# Patient Record
Sex: Female | Born: 1940 | Race: White | Hispanic: No | Marital: Married
Health system: Southern US, Community
[De-identification: ages and names within clinical notes are randomized; demographics above are authoritative.]

---

## 2005-04-08 ENCOUNTER — Emergency Department: Payer: Self-pay | Admitting: Emergency Medicine

## 2006-05-13 ENCOUNTER — Ambulatory Visit: Payer: Self-pay | Admitting: Ophthalmology

## 2006-05-20 ENCOUNTER — Ambulatory Visit: Payer: Self-pay | Admitting: Ophthalmology

## 2006-07-02 ENCOUNTER — Ambulatory Visit: Payer: Self-pay | Admitting: Ophthalmology

## 2006-07-17 ENCOUNTER — Ambulatory Visit: Payer: Self-pay | Admitting: Ophthalmology

## 2007-03-04 ENCOUNTER — Ambulatory Visit: Payer: Self-pay | Admitting: Gastroenterology

## 2008-11-17 ENCOUNTER — Ambulatory Visit: Payer: Self-pay

## 2009-05-31 ENCOUNTER — Inpatient Hospital Stay: Payer: Self-pay | Admitting: Surgery

## 2010-05-01 ENCOUNTER — Ambulatory Visit: Payer: Self-pay | Admitting: Ophthalmology

## 2010-05-15 ENCOUNTER — Ambulatory Visit: Payer: Self-pay | Admitting: Ophthalmology

## 2011-01-05 ENCOUNTER — Inpatient Hospital Stay: Payer: Self-pay | Admitting: Internal Medicine

## 2011-05-09 ENCOUNTER — Ambulatory Visit: Payer: Self-pay | Admitting: Ophthalmology

## 2011-05-23 ENCOUNTER — Ambulatory Visit: Payer: Self-pay | Admitting: Ophthalmology

## 2011-12-05 ENCOUNTER — Ambulatory Visit: Payer: Self-pay | Admitting: Specialist

## 2012-01-09 ENCOUNTER — Ambulatory Visit: Payer: Self-pay | Admitting: Specialist

## 2012-07-16 ENCOUNTER — Ambulatory Visit: Payer: Self-pay | Admitting: Specialist

## 2012-10-29 ENCOUNTER — Inpatient Hospital Stay: Payer: Self-pay | Admitting: Internal Medicine

## 2012-10-29 LAB — CBC
MCHC: 33.7 g/dL (ref 32.0–36.0)
MCV: 90 fL (ref 80–100)
Platelet: 210 10*3/uL (ref 150–440)
RBC: 4.44 10*6/uL (ref 3.80–5.20)
RDW: 15.5 % — ABNORMAL HIGH (ref 11.5–14.5)
WBC: 11.4 10*3/uL — ABNORMAL HIGH (ref 3.6–11.0)

## 2012-10-29 LAB — COMPREHENSIVE METABOLIC PANEL
Anion Gap: 5 — ABNORMAL LOW (ref 7–16)
BUN: 14 mg/dL (ref 7–18)
Bilirubin,Total: 0.3 mg/dL (ref 0.2–1.0)
Chloride: 102 mmol/L (ref 98–107)
Creatinine: 0.79 mg/dL (ref 0.60–1.30)
EGFR (African American): >60
Glucose: 115 mg/dL — ABNORMAL HIGH (ref 65–99)
Osmolality: 277 (ref 275–301)
Total Protein: 7.4 g/dL (ref 6.4–8.2)

## 2012-10-29 LAB — URINALYSIS, COMPLETE
Bilirubin,UR: NEGATIVE
Blood: NEGATIVE
Leukocyte Esterase: NEGATIVE
Nitrite: POSITIVE
Ph: 6 (ref 4.5–8.0)
Protein: 100
Specific Gravity: 1.017 (ref 1.003–1.030)
WBC UR: 4 /HPF (ref 0–5)

## 2012-10-29 LAB — CK TOTAL AND CKMB (NOT AT ARMC)
CK, Total: 99 U/L (ref 21–215)
CK-MB: 1.5 ng/mL (ref 0.5–3.6)

## 2012-10-30 LAB — CBC WITH DIFFERENTIAL/PLATELET
Basophil #: 0 10*3/uL (ref 0.0–0.1)
Eosinophil %: 0 %
HGB: 12.8 g/dL (ref 12.0–16.0)
Lymphocyte #: 0.5 10*3/uL — ABNORMAL LOW (ref 1.0–3.6)
MCH: 30.2 pg (ref 26.0–34.0)
MCV: 91 fL (ref 80–100)
Monocyte #: 0.1 x10 3/mm — ABNORMAL LOW (ref 0.2–0.9)
Monocyte %: 1.1 %
Neutrophil %: 93.5 %
Platelet: 198 10*3/uL (ref 150–440)
RBC: 4.24 10*6/uL (ref 3.80–5.20)
RDW: 15.9 % — ABNORMAL HIGH (ref 11.5–14.5)
WBC: 8.6 10*3/uL (ref 3.6–11.0)

## 2012-10-30 LAB — BASIC METABOLIC PANEL
Anion Gap: 5 — ABNORMAL LOW (ref 7–16)
BUN: 13 mg/dL (ref 7–18)
Calcium, Total: 8.7 mg/dL (ref 8.5–10.1)
Co2: 32 mmol/L (ref 21–32)
Creatinine: 0.71 mg/dL (ref 0.60–1.30)
EGFR (African American): >60
EGFR (Non-African Amer.): >60
Glucose: 146 mg/dL — ABNORMAL HIGH (ref 65–99)
Osmolality: 277 (ref 275–301)
Potassium: 3.8 mmol/L (ref 3.5–5.1)

## 2012-10-30 LAB — MAGNESIUM: Magnesium: 1.9 mg/dL

## 2012-11-04 LAB — CULTURE, BLOOD (SINGLE)

## 2013-01-05 ENCOUNTER — Inpatient Hospital Stay: Payer: Self-pay | Admitting: Internal Medicine

## 2013-01-05 LAB — CBC WITH DIFFERENTIAL/PLATELET
Basophil #: 0 10*3/uL (ref 0.0–0.1)
Eosinophil #: 0 10*3/uL (ref 0.0–0.7)
Eosinophil %: 0 %
HCT: 43.3 % (ref 35.0–47.0)
Lymphocyte %: 5.9 %
MCHC: 33.2 g/dL (ref 32.0–36.0)
MCV: 94 fL (ref 80–100)
Monocyte #: 1.2 x10 3/mm — ABNORMAL HIGH (ref 0.2–0.9)
Monocyte %: 7.7 %
Neutrophil %: 86.2 %
RDW: 14.9 % — ABNORMAL HIGH (ref 11.5–14.5)
WBC: 16 10*3/uL — ABNORMAL HIGH (ref 3.6–11.0)

## 2013-01-05 LAB — COMPREHENSIVE METABOLIC PANEL
Albumin: 3.4 g/dL (ref 3.4–5.0)
Alkaline Phosphatase: 105 U/L (ref 50–136)
Anion Gap: 6 — ABNORMAL LOW (ref 7–16)
Bilirubin,Total: 0.2 mg/dL (ref 0.2–1.0)
Chloride: 100 mmol/L (ref 98–107)
EGFR (African American): 55 — ABNORMAL LOW
EGFR (Non-African Amer.): 47 — ABNORMAL LOW
Potassium: 4.4 mmol/L (ref 3.5–5.1)
SGPT (ALT): 26 U/L (ref 12–78)

## 2013-01-05 LAB — TROPONIN I
Troponin-I: 0.04 ng/mL
Troponin-I: 0.04 ng/mL

## 2013-01-06 LAB — BASIC METABOLIC PANEL
Anion Gap: 8 (ref 7–16)
Chloride: 96 mmol/L — ABNORMAL LOW (ref 98–107)
Creatinine: 1.03 mg/dL (ref 0.60–1.30)
EGFR (African American): >60
EGFR (Non-African Amer.): 54 — ABNORMAL LOW
Glucose: 140 mg/dL — ABNORMAL HIGH (ref 65–99)
Osmolality: 285 (ref 275–301)
Sodium: 138 mmol/L (ref 136–145)

## 2013-01-06 LAB — CBC WITH DIFFERENTIAL/PLATELET
Basophil #: 0 10*3/uL (ref 0.0–0.1)
Basophil %: 0.3 %
Lymphocyte #: 1 10*3/uL (ref 1.0–3.6)
Lymphocyte %: 7.5 %
MCV: 94 fL (ref 80–100)
Monocyte %: 2.9 %
Neutrophil %: 89.3 %
Platelet: 278 10*3/uL (ref 150–440)
RBC: 4.56 10*6/uL (ref 3.80–5.20)
RDW: 14.8 % — ABNORMAL HIGH (ref 11.5–14.5)

## 2013-01-08 LAB — BASIC METABOLIC PANEL
Anion Gap: 9 (ref 7–16)
Chloride: 93 mmol/L — ABNORMAL LOW (ref 98–107)
Co2: 32 mmol/L (ref 21–32)
Creatinine: 1.29 mg/dL (ref 0.60–1.30)
EGFR (African American): 48 — ABNORMAL LOW
EGFR (Non-African Amer.): 41 — ABNORMAL LOW
Glucose: 118 mg/dL — ABNORMAL HIGH (ref 65–99)
Osmolality: 289 (ref 275–301)
Potassium: 4.1 mmol/L (ref 3.5–5.1)
Sodium: 134 mmol/L — ABNORMAL LOW (ref 136–145)

## 2013-01-08 LAB — CBC WITH DIFFERENTIAL/PLATELET
Basophil #: 0.1 10*3/uL (ref 0.0–0.1)
Basophil %: 0.2 %
Comment - H1-Com3: NORMAL
Eosinophil #: 0 10*3/uL (ref 0.0–0.7)
HCT: 49.4 % — ABNORMAL HIGH (ref 35.0–47.0)
HGB: 15.2 g/dL (ref 12.0–16.0)
HGB: 16.5 g/dL — ABNORMAL HIGH (ref 12.0–16.0)
Lymphocyte %: 3.8 %
Lymphocytes: 4 %
MCHC: 33.4 g/dL (ref 32.0–36.0)
MCHC: 33.3 g/dL (ref 32.0–36.0)
MCV: 93 fL (ref 80–100)
Monocyte #: 2.3 x10 3/mm — ABNORMAL HIGH (ref 0.2–0.9)
Monocytes: 7 %
Neutrophil #: 23.6 10*3/uL — ABNORMAL HIGH (ref 1.4–6.5)
Neutrophil %: 87.3 %
Platelet: 332 10*3/uL (ref 150–440)
RBC: 4.88 10*6/uL (ref 3.80–5.20)
RBC: 5.34 10*6/uL — ABNORMAL HIGH (ref 3.80–5.20)
RDW: 14.3 % (ref 11.5–14.5)
RDW: 14.3 % (ref 11.5–14.5)
Variant Lymphocyte - H1-Rlymph: 2 %

## 2013-01-08 LAB — HEMOGLOBIN: HGB: 14.3 g/dL (ref 12.0–16.0)

## 2013-01-09 LAB — CBC WITH DIFFERENTIAL/PLATELET
Basophil #: 0 10*3/uL (ref 0.0–0.1)
Basophil %: 0.2 %
Eosinophil #: 0 10*3/uL (ref 0.0–0.7)
Eosinophil #: 0 10*3/uL (ref 0.0–0.7)
Eosinophil %: 0.1 %
HCT: 47.8 % — ABNORMAL HIGH (ref 35.0–47.0)
HCT: 39.1 % (ref 35.0–47.0)
HGB: 15.9 g/dL (ref 12.0–16.0)
Lymphocyte #: 1.7 10*3/uL (ref 1.0–3.6)
Lymphocyte #: 0.7 10*3/uL — ABNORMAL LOW (ref 1.0–3.6)
Lymphocyte %: 7.1 %
Lymphocyte %: 2.4 %
MCH: 30.8 pg (ref 26.0–34.0)
MCH: 31 pg (ref 26.0–34.0)
MCHC: 33.3 g/dL (ref 32.0–36.0)
MCV: 92 fL (ref 80–100)
Monocyte #: 1 x10 3/mm — ABNORMAL HIGH (ref 0.2–0.9)
Monocyte %: 9.4 %
Monocyte %: 3.6 %
Neutrophil %: 83.2 %
Neutrophil %: 93.8 %
Platelet: 304 10*3/uL (ref 150–440)
Platelet: 208 10*3/uL (ref 150–440)
RBC: 4.25 10*6/uL (ref 3.80–5.20)
RDW: 14.3 % (ref 11.5–14.5)
RDW: 14.5 % (ref 11.5–14.5)
WBC: 23.8 10*3/uL — ABNORMAL HIGH (ref 3.6–11.0)
WBC: 26.7 10*3/uL — ABNORMAL HIGH (ref 3.6–11.0)

## 2013-01-10 LAB — CBC WITH DIFFERENTIAL/PLATELET
Basophil %: 0.1 %
Eosinophil #: 0 10*3/uL (ref 0.0–0.7)
Eosinophil %: 0.1 %
HCT: 36.9 % (ref 35.0–47.0)
HGB: 12.3 g/dL (ref 12.0–16.0)
Lymphocyte %: 7.5 %
MCHC: 33.4 g/dL (ref 32.0–36.0)
MCV: 93 fL (ref 80–100)
Monocyte #: 2.2 x10 3/mm — ABNORMAL HIGH (ref 0.2–0.9)
Monocyte %: 7.4 %
RBC: 3.97 10*6/uL (ref 3.80–5.20)
WBC: 29.9 10*3/uL — ABNORMAL HIGH (ref 3.6–11.0)

## 2013-01-10 LAB — CULTURE, BLOOD (SINGLE)

## 2013-01-11 LAB — CBC WITH DIFFERENTIAL/PLATELET
Eosinophil %: 0.4 %
HGB: 11.5 g/dL — ABNORMAL LOW (ref 12.0–16.0)
Lymphocyte #: 2.4 10*3/uL (ref 1.0–3.6)
MCH: 31 pg (ref 26.0–34.0)
MCHC: 33.3 g/dL (ref 32.0–36.0)
MCV: 93 fL (ref 80–100)
Monocyte #: 1.7 x10 3/mm — ABNORMAL HIGH (ref 0.2–0.9)
Monocyte %: 6.5 %
Neutrophil #: 21.2 10*3/uL — ABNORMAL HIGH (ref 1.4–6.5)
RDW: 14.1 % (ref 11.5–14.5)
WBC: 25.4 10*3/uL — ABNORMAL HIGH (ref 3.6–11.0)

## 2013-01-11 LAB — BASIC METABOLIC PANEL
Anion Gap: 5 — ABNORMAL LOW (ref 7–16)
BUN: 21 mg/dL — ABNORMAL HIGH (ref 7–18)
Chloride: 98 mmol/L (ref 98–107)
Co2: 34 mmol/L — ABNORMAL HIGH (ref 21–32)
EGFR (Non-African Amer.): >60
Osmolality: 276 (ref 275–301)
Potassium: 3.5 mmol/L (ref 3.5–5.1)
Sodium: 137 mmol/L (ref 136–145)

## 2013-01-12 LAB — CBC WITH DIFFERENTIAL/PLATELET
Eosinophil #: 0 10*3/uL (ref 0.0–0.7)
HGB: 11.1 g/dL — ABNORMAL LOW (ref 12.0–16.0)
Lymphocyte %: 8.7 %
MCV: 93 fL (ref 80–100)
Monocyte #: 1.4 x10 3/mm — ABNORMAL HIGH (ref 0.2–0.9)
Monocyte %: 6.3 %
Neutrophil %: 84.8 %
Platelet: 188 10*3/uL (ref 150–440)
RBC: 3.56 10*6/uL — ABNORMAL LOW (ref 3.80–5.20)
RDW: 13.9 % (ref 11.5–14.5)
WBC: 22.3 10*3/uL — ABNORMAL HIGH (ref 3.6–11.0)

## 2013-01-12 LAB — DIGOXIN LEVEL: Digoxin: 1.29 ng/mL

## 2013-01-12 LAB — PATHOLOGY REPORT

## 2013-01-13 LAB — CBC WITH DIFFERENTIAL/PLATELET
HCT: 33.8 % — ABNORMAL LOW (ref 35.0–47.0)
Lymphocytes: 6 %
MCH: 30.9 pg (ref 26.0–34.0)
MCHC: 33.2 g/dL (ref 32.0–36.0)
Monocytes: 6 %
Myelocyte: 2 %
RBC: 3.64 10*6/uL — ABNORMAL LOW (ref 3.80–5.20)
RDW: 14.2 % (ref 11.5–14.5)
WBC: 19.5 10*3/uL — ABNORMAL HIGH (ref 3.6–11.0)

## 2013-02-19 ENCOUNTER — Ambulatory Visit: Payer: Self-pay | Admitting: Specialist

## 2013-07-13 ENCOUNTER — Ambulatory Visit: Payer: Self-pay | Admitting: Specialist

## 2013-08-22 IMAGING — CT CT CHEST W/O CM
1 of 2 series · 14 of 32 positions shown, 18 images · non-contrast
Comparison: none

REASON FOR EXAM: Pulmonary Nodule
COMMENTS:

[Series 2: chest w/o 3.0 i31f 2 · axial · non-contrast · 0.66mm/px · z∈[-640,-391]mm · 14 of 99 slices shown, 18 images]
[im 8/99  mediastinal]
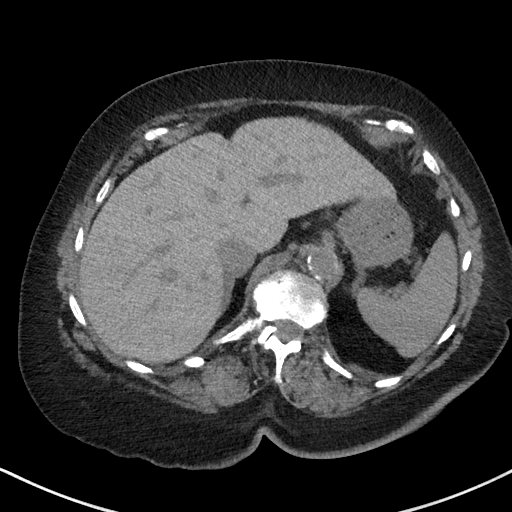
[im 8/99  lung]
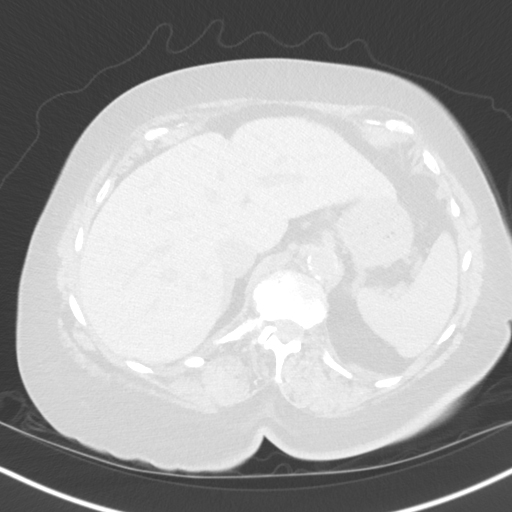
[im 16/99  lung]
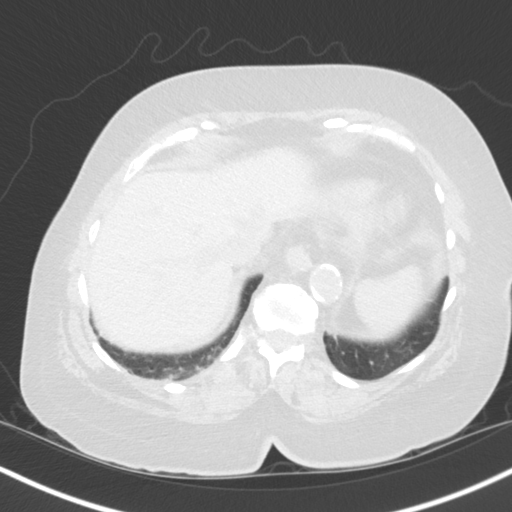
[im 23/99  lung]
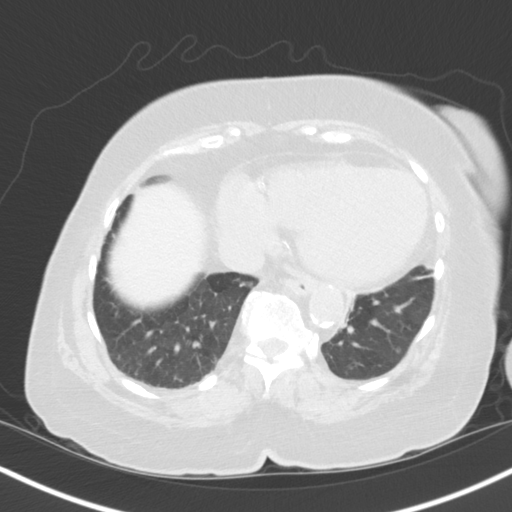
[im 31/99  lung]
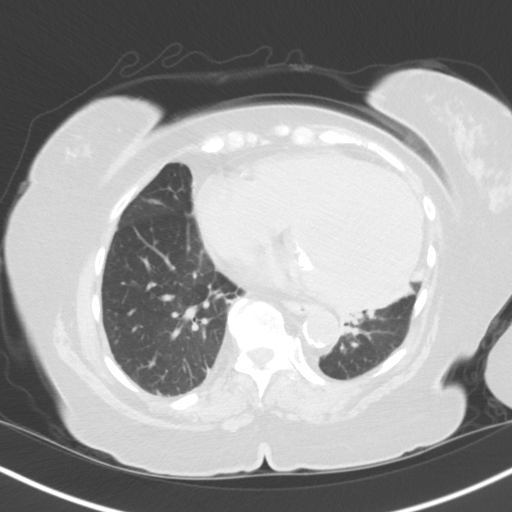
[im 38/99  mediastinal]
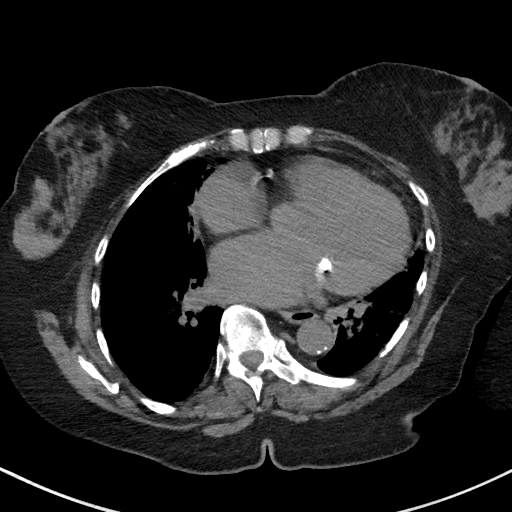
[im 38/99  lung]
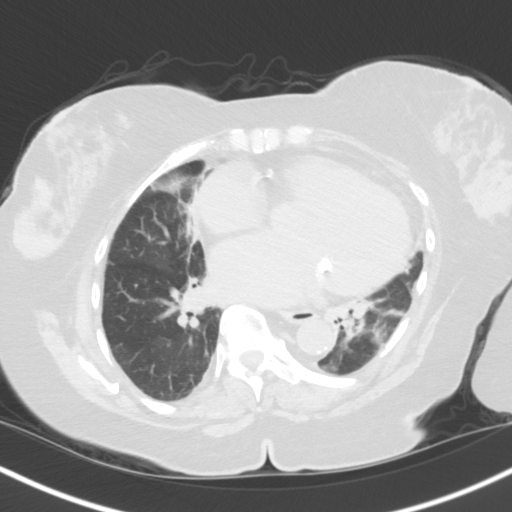
[im 46/99  lung]
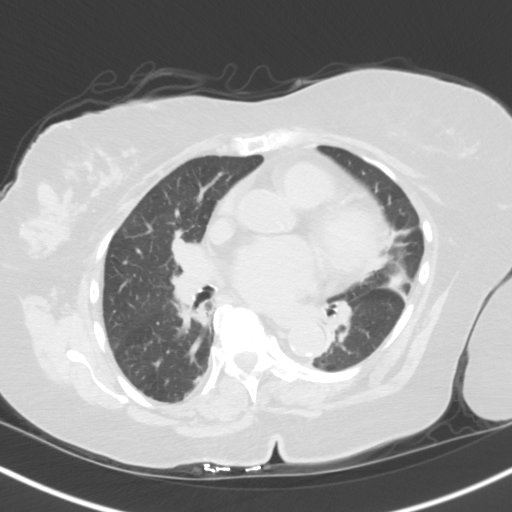
[im 47/99  lung]
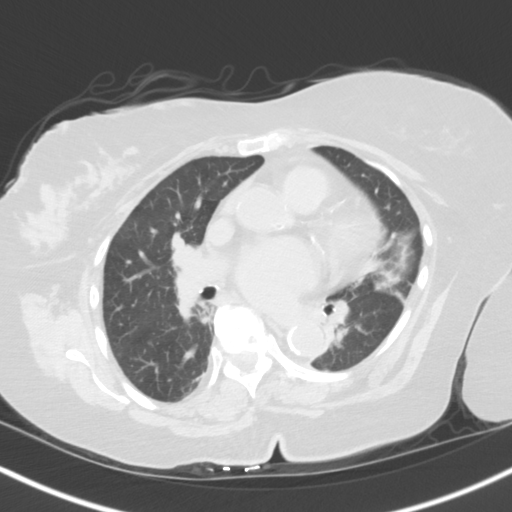
[im 50/99  lung]
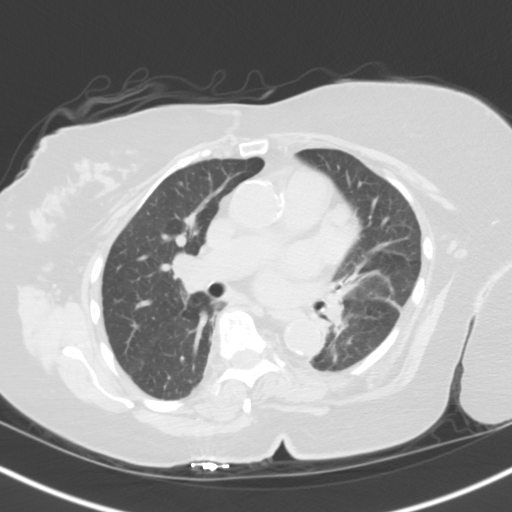
[im 53/99  mediastinal]
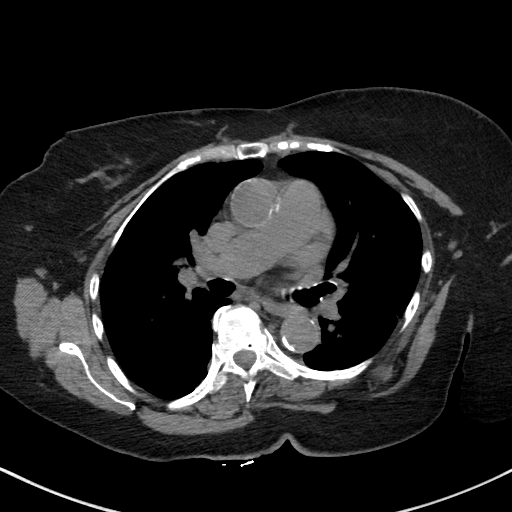
[im 53/99  lung]
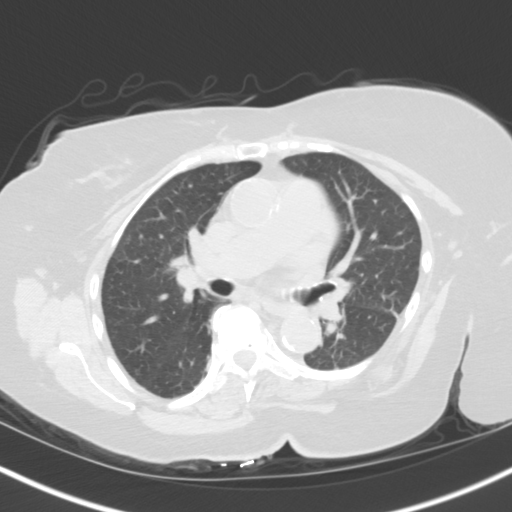
[im 61/99  lung]
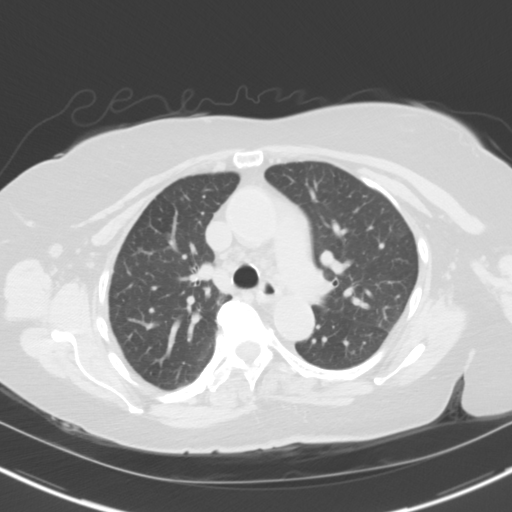
[im 68/99  lung]
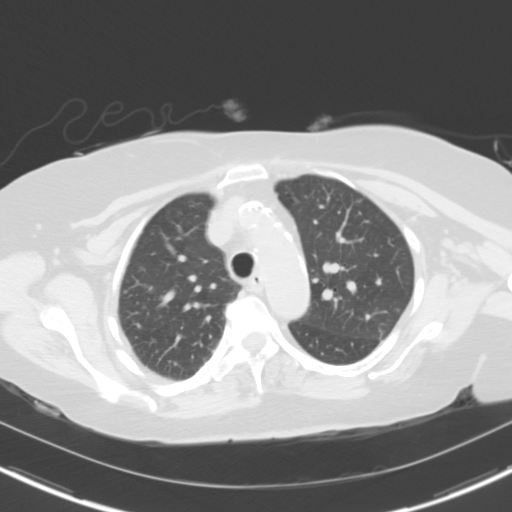
[im 76/99  lung]
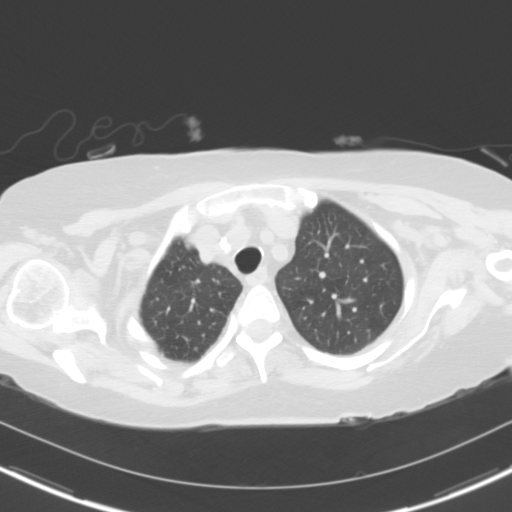
[im 83/99  mediastinal]
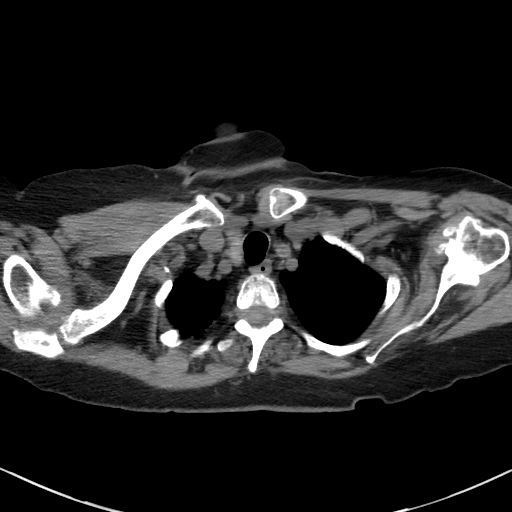
[im 83/99  lung]
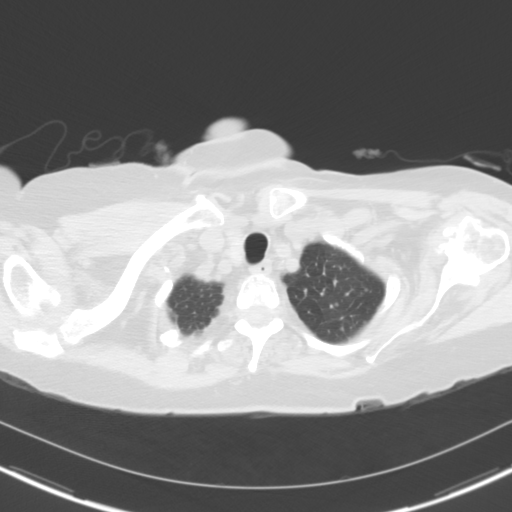
[im 91/99  lung]
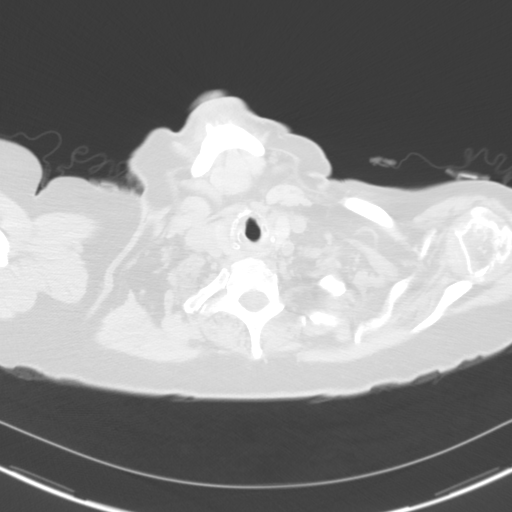

[14 of 32 positions shown; findings below may reference images not displayed]

PROCEDURE:     KCT - KCT CHEST WITHOUT CONTRAST  - July 13, 2013 [DATE]

RESULT:     Axial noncontrast CT scanning was performed through the chest
with reconstructions at 3 mm intervals and slice thicknesses. Comparison is
made to a study January 13, 2013 and to a study July 16, 2012. Review
of multiplanar reconstructed images was performed separately on the VIA
monitor.

In the right upper lobe on image 26 there is a stable 2 mm diameter nodule.
On image 44 in the right upper lobe subpleural A there is a stable
approximately 4 mm diameter nodule. On image 52 in the posterior lateral
aspect of the right upper lobe there is a stable 3 mm diameter nodule on
image 57 a stable subpleural is present in the extreme infero- lateral
aspect of the right upper lobe. On image 28 there is a stable nodule in the
posterior aspect of the left upper lobe. On image 29 in the extreme superior
lateral aspect of the left lower lobe there is a stable 2 mm diameter
nodule.

The cardiac chambers are enlarged. There is mural calcification within the
normal calibered thoracic aorta. There are coronary artery calcifications
present. No bulky mediastinal or hilar lymph nodes are evident.

Within the upper abdomen the observed portions of the liver and spleen and
adrenal glands appear normal.
IMPRESSION: 1. There are multiple stable subcentimeter nodules in both lungs. These are
most conspicuous on the right. I do not see new nodules.
2. There is no classic alveolar pneumonia. There are patchy areas of
increased interstitial density in the left lower lobe which are not new.
Previously demonstrated pleural effusions have resolved.
3. There is stable enlargement of the cardiac chambers.

[REDACTED]

## 2014-05-19 ENCOUNTER — Inpatient Hospital Stay: Payer: Self-pay

## 2014-05-19 LAB — BASIC METABOLIC PANEL
Anion Gap: 9 (ref 7–16)
BUN: 19 mg/dL — AB (ref 7–18)
CALCIUM: 8.8 mg/dL (ref 8.5–10.1)
CO2: 25 mmol/L (ref 21–32)
Chloride: 102 mmol/L (ref 98–107)
Creatinine: 0.99 mg/dL (ref 0.60–1.30)
EGFR (African American): >60
EGFR (Non-African Amer.): 57 — ABNORMAL LOW
GLUCOSE: 315 mg/dL — AB (ref 65–99)
Osmolality: 286 (ref 275–301)
POTASSIUM: 5.2 mmol/L — AB (ref 3.5–5.1)
SODIUM: 136 mmol/L (ref 136–145)

## 2014-05-19 LAB — CBC
HCT: 45.6 % (ref 35.0–47.0)
HGB: 14.3 g/dL (ref 12.0–16.0)
MCH: 30.8 pg (ref 26.0–34.0)
MCHC: 31.4 g/dL — ABNORMAL LOW (ref 32.0–36.0)
MCV: 98 fL (ref 80–100)
Platelet: 267 10*3/uL (ref 150–440)
RBC: 4.65 10*6/uL (ref 3.80–5.20)
RDW: 13.9 % (ref 11.5–14.5)
WBC: 13.5 10*3/uL — ABNORMAL HIGH (ref 3.6–11.0)

## 2014-05-19 LAB — URINALYSIS, COMPLETE
Bilirubin,UR: NEGATIVE
Blood: NEGATIVE
GLUCOSE, UR: NEGATIVE mg/dL (ref 0–75)
Hyaline Cast: 5
Ketone: NEGATIVE
LEUKOCYTE ESTERASE: NEGATIVE
NITRITE: NEGATIVE
PROTEIN: NEGATIVE
Ph: 5 (ref 4.5–8.0)
SPECIFIC GRAVITY: 1.008 (ref 1.003–1.030)
Squamous Epithelial: NONE SEEN

## 2014-05-19 LAB — TROPONIN I: Troponin-I: 0.04 ng/mL

## 2014-05-19 LAB — PRO B NATRIURETIC PEPTIDE: B-TYPE NATIURETIC PEPTID: 1322 pg/mL — AB (ref 0–125)

## 2014-05-20 LAB — CBC WITH DIFFERENTIAL/PLATELET
BASOS ABS: 0 10*3/uL (ref 0.0–0.1)
BASOS PCT: 0.3 %
Basophil #: 0.1 10*3/uL (ref 0.0–0.1)
Basophil #: 0 10*3/uL (ref 0.0–0.1)
Basophil %: 0.2 %
Basophil %: 0.8 %
EOS ABS: 0.1 10*3/uL (ref 0.0–0.7)
EOS PCT: 0.3 %
EOS PCT: 0.4 %
Eosinophil #: 0 10*3/uL (ref 0.0–0.7)
Eosinophil #: 0.1 10*3/uL (ref 0.0–0.7)
Eosinophil %: 0.9 %
HCT: 36.9 % (ref 35.0–47.0)
HCT: 37.5 % (ref 35.0–47.0)
HCT: 36.3 % (ref 35.0–47.0)
HGB: 11.6 g/dL — ABNORMAL LOW (ref 12.0–16.0)
HGB: 12.3 g/dL (ref 12.0–16.0)
HGB: 11.9 g/dL — ABNORMAL LOW (ref 12.0–16.0)
Lymphocyte #: 1 10*3/uL (ref 1.0–3.6)
Lymphocyte #: 1.4 10*3/uL (ref 1.0–3.6)
Lymphocyte #: 1 10*3/uL (ref 1.0–3.6)
Lymphocyte %: 8.3 %
Lymphocyte %: 9.8 %
Lymphocyte %: 9.3 %
MCH: 30.9 pg (ref 26.0–34.0)
MCH: 31 pg (ref 26.0–34.0)
MCH: 31.1 pg (ref 26.0–34.0)
MCHC: 31.4 g/dL — AB (ref 32.0–36.0)
MCHC: 32.7 g/dL (ref 32.0–36.0)
MCHC: 32.8 g/dL (ref 32.0–36.0)
MCV: 99 fL (ref 80–100)
MCV: 95 fL (ref 80–100)
MCV: 95 fL (ref 80–100)
MONO ABS: 0.9 x10 3/mm (ref 0.2–0.9)
MONOS PCT: 7.8 %
Monocyte #: 1.1 x10 3/mm — ABNORMAL HIGH (ref 0.2–0.9)
Monocyte #: 0.9 x10 3/mm (ref 0.2–0.9)
Monocyte %: 8 %
Monocyte %: 8 %
NEUTROS ABS: 9.6 10*3/uL — AB (ref 1.4–6.5)
NEUTROS PCT: 83.4 %
NEUTROS PCT: 81 %
Neutrophil #: 11.3 10*3/uL — ABNORMAL HIGH (ref 1.4–6.5)
Neutrophil #: 9.2 10*3/uL — ABNORMAL HIGH (ref 1.4–6.5)
Neutrophil %: 81.5 %
PLATELETS: 180 10*3/uL (ref 150–440)
Platelet: 187 10*3/uL (ref 150–440)
Platelet: 183 10*3/uL (ref 150–440)
RBC: 3.74 10*6/uL — ABNORMAL LOW (ref 3.80–5.20)
RBC: 3.96 10*6/uL (ref 3.80–5.20)
RBC: 3.83 10*6/uL (ref 3.80–5.20)
RDW: 13.9 % (ref 11.5–14.5)
RDW: 13.4 % (ref 11.5–14.5)
RDW: 13.9 % (ref 11.5–14.5)
WBC: 11.6 10*3/uL — ABNORMAL HIGH (ref 3.6–11.0)
WBC: 13.9 10*3/uL — ABNORMAL HIGH (ref 3.6–11.0)
WBC: 11.3 10*3/uL — ABNORMAL HIGH (ref 3.6–11.0)

## 2014-05-20 LAB — BASIC METABOLIC PANEL
ANION GAP: 6 — AB (ref 7–16)
Anion Gap: 8 (ref 7–16)
BUN: 19 mg/dL — ABNORMAL HIGH (ref 7–18)
BUN: 21 mg/dL — AB (ref 7–18)
CREATININE: 0.97 mg/dL (ref 0.60–1.30)
Calcium, Total: 7.6 mg/dL — ABNORMAL LOW (ref 8.5–10.1)
Calcium, Total: 8.3 mg/dL — ABNORMAL LOW (ref 8.5–10.1)
Chloride: 90 mmol/L — ABNORMAL LOW (ref 98–107)
Chloride: 104 mmol/L (ref 98–107)
Co2: 28 mmol/L (ref 21–32)
Co2: 30 mmol/L (ref 21–32)
Creatinine: 0.92 mg/dL (ref 0.60–1.30)
EGFR (African American): >60
EGFR (Non-African Amer.): 58 — ABNORMAL LOW
EGFR (Non-African Amer.): >60
Glucose: 656 mg/dL (ref 65–99)
Glucose: 91 mg/dL (ref 65–99)
OSMOLALITY: 287 (ref 275–301)
OSMOLALITY: 282 (ref 275–301)
POTASSIUM: 4 mmol/L (ref 3.5–5.1)
Potassium: 4.3 mmol/L (ref 3.5–5.1)
Sodium: 126 mmol/L — ABNORMAL LOW (ref 136–145)
Sodium: 140 mmol/L (ref 136–145)

## 2014-05-20 LAB — APTT
ACTIVATED PTT: 34.9 s (ref 23.6–35.9)
Activated PTT: 70.6 secs — ABNORMAL HIGH (ref 23.6–35.9)

## 2014-05-20 LAB — CK TOTAL AND CKMB (NOT AT ARMC)
CK, TOTAL: 600 U/L — AB
CK, Total: 528 U/L — ABNORMAL HIGH
CK-MB: 40 ng/mL — ABNORMAL HIGH (ref 0.5–3.6)
CK-MB: 42.3 ng/mL — AB (ref 0.5–3.6)

## 2014-05-20 LAB — TROPONIN I
TROPONIN-I: 18 ng/mL — AB
TROPONIN-I: 18 ng/mL — AB
Troponin-I: 27 ng/mL — ABNORMAL HIGH

## 2014-05-20 LAB — CK-MB: CK-MB: 44.9 ng/mL — ABNORMAL HIGH (ref 0.5–3.6)

## 2014-05-21 LAB — CBC
HCT: 34.7 % — ABNORMAL LOW (ref 35.0–47.0)
HGB: 11.7 g/dL — ABNORMAL LOW (ref 12.0–16.0)
MCH: 32.2 pg (ref 26.0–34.0)
MCHC: 33.7 g/dL (ref 32.0–36.0)
MCV: 95 fL (ref 80–100)
Platelet: 175 10*3/uL (ref 150–440)
RBC: 3.63 10*6/uL — ABNORMAL LOW (ref 3.80–5.20)
RDW: 13.6 % (ref 11.5–14.5)
WBC: 9.3 10*3/uL (ref 3.6–11.0)

## 2014-05-21 LAB — PRO B NATRIURETIC PEPTIDE: B-Type Natriuretic Peptide: 2326 pg/mL — ABNORMAL HIGH (ref 0–125)

## 2014-05-21 LAB — BASIC METABOLIC PANEL
ANION GAP: 3 — AB (ref 7–16)
BUN: 16 mg/dL (ref 7–18)
CALCIUM: 8.3 mg/dL — AB (ref 8.5–10.1)
Chloride: 104 mmol/L (ref 98–107)
Co2: 32 mmol/L (ref 21–32)
Creatinine: 0.95 mg/dL (ref 0.60–1.30)
EGFR (Non-African Amer.): 59 — ABNORMAL LOW
GLUCOSE: 92 mg/dL (ref 65–99)
OSMOLALITY: 278 (ref 275–301)
Potassium: 3.9 mmol/L (ref 3.5–5.1)
Sodium: 139 mmol/L (ref 136–145)

## 2014-05-21 LAB — LIPID PANEL
Cholesterol: 155 mg/dL (ref 0–200)
HDL Cholesterol: 36 mg/dL — ABNORMAL LOW (ref 40–60)
Ldl Cholesterol, Calc: 87 mg/dL (ref 0–100)
Triglycerides: 159 mg/dL (ref 0–200)
VLDL Cholesterol, Calc: 32 mg/dL (ref 5–40)

## 2014-05-21 LAB — CK TOTAL AND CKMB (NOT AT ARMC)
CK, TOTAL: 240 U/L — AB
CK-MB: 16.5 ng/mL — ABNORMAL HIGH (ref 0.5–3.6)

## 2014-05-22 LAB — BASIC METABOLIC PANEL
Anion Gap: 3 — ABNORMAL LOW (ref 7–16)
BUN: 16 mg/dL (ref 7–18)
CALCIUM: 8.7 mg/dL (ref 8.5–10.1)
CO2: 30 mmol/L (ref 21–32)
Chloride: 104 mmol/L (ref 98–107)
Creatinine: 0.72 mg/dL (ref 0.60–1.30)
EGFR (Non-African Amer.): >60
Glucose: 96 mg/dL (ref 65–99)
Osmolality: 275 (ref 275–301)
Potassium: 4.5 mmol/L (ref 3.5–5.1)
Sodium: 137 mmol/L (ref 136–145)

## 2014-07-19 ENCOUNTER — Ambulatory Visit: Payer: Self-pay | Admitting: Specialist

## 2014-08-10 DEATH — deceased

## 2014-08-28 IMAGING — CT CT CHEST W/O CM
2 of 3 series · 15 of 36 positions shown, 18 images · non-contrast
Comparison: 07/13/2013, 07/16/2012

CLINICAL DATA: Followup pulmonary nodules.

EXAM:
CT CHEST WITHOUT CONTRAST
TECHNIQUE: Multidetector CT imaging of the chest was performed following the
standard protocol without IV contrast..

[Series 3: routine chest wo · axial · 0.65mm/px · z∈[-615,-370]mm · 12 of 59 slices shown, 15 images]
[im 5/59  mediastinal]
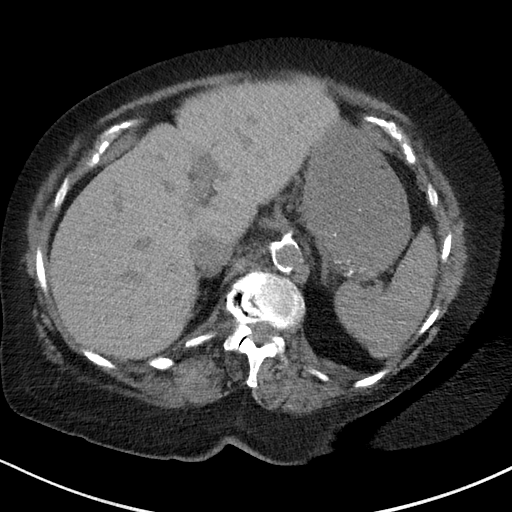
[im 5/59  lung]
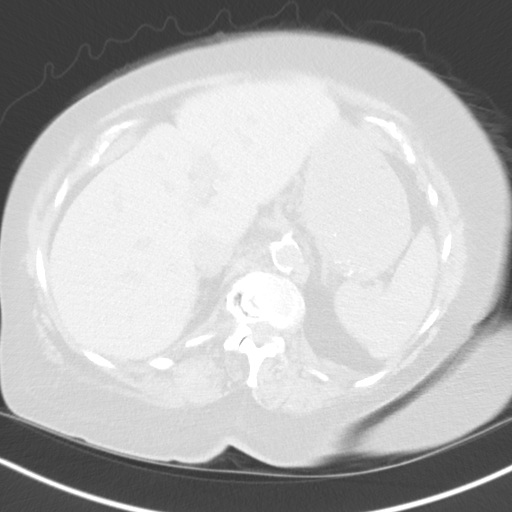
[im 9/59  lung]
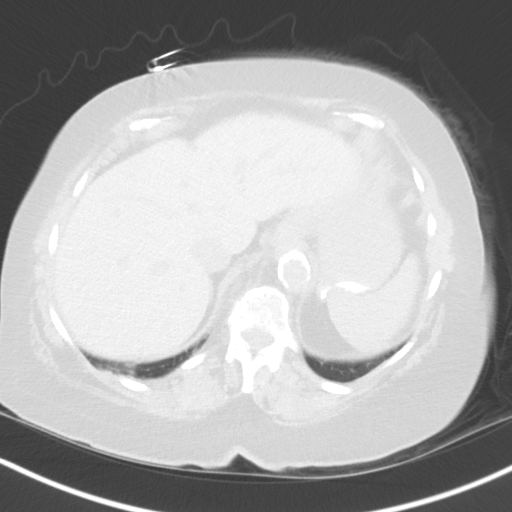
[im 13/59  lung]
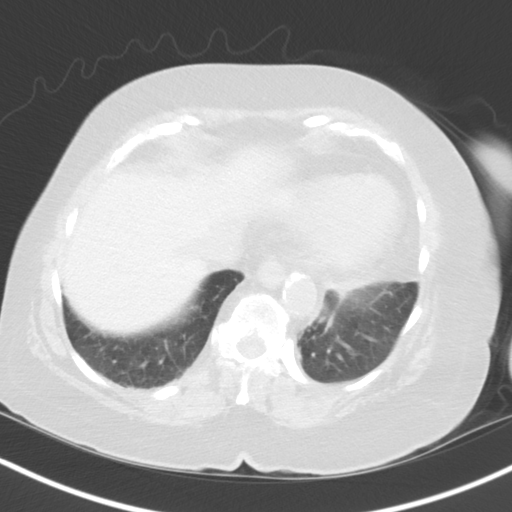
[im 18/59  lung]
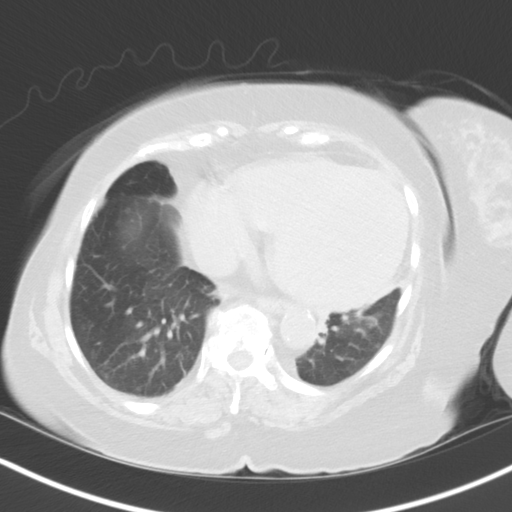
[im 22/59  mediastinal]
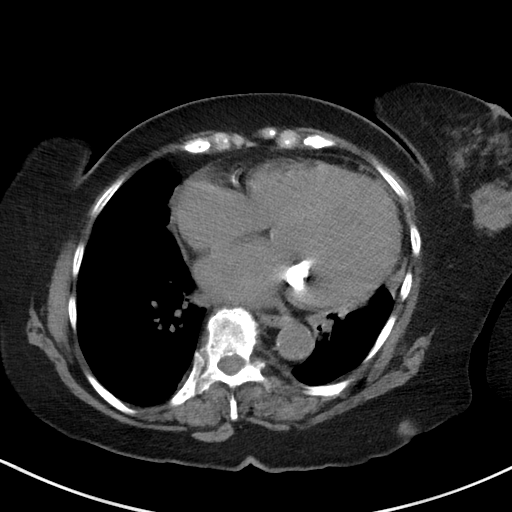
[im 22/59  lung]
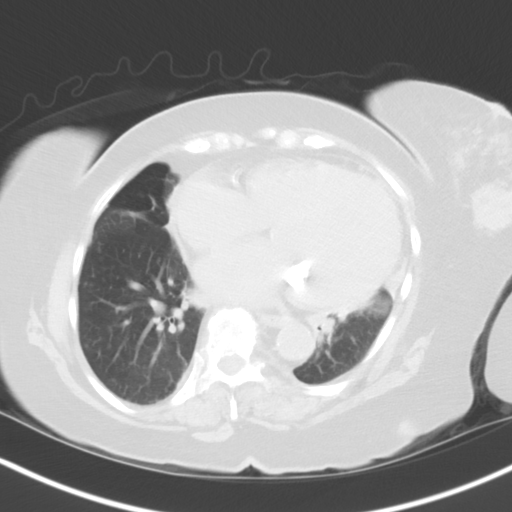
[im 26/59  lung]
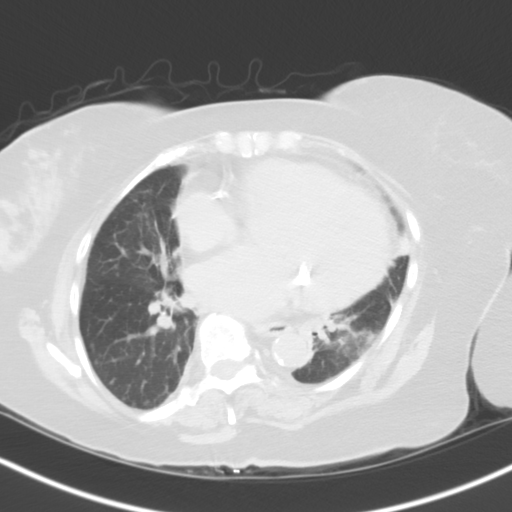
[im 33/59  lung]
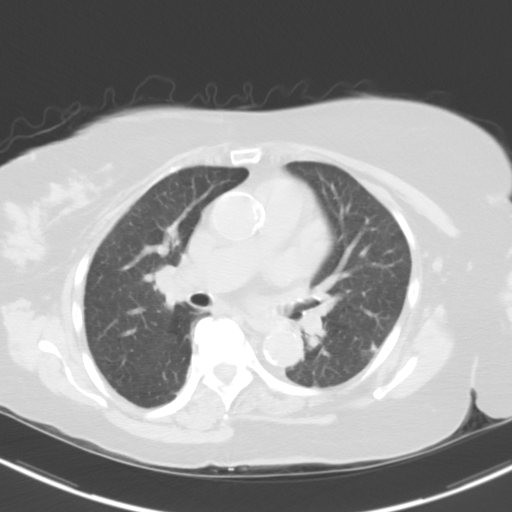
[im 37/59  lung]
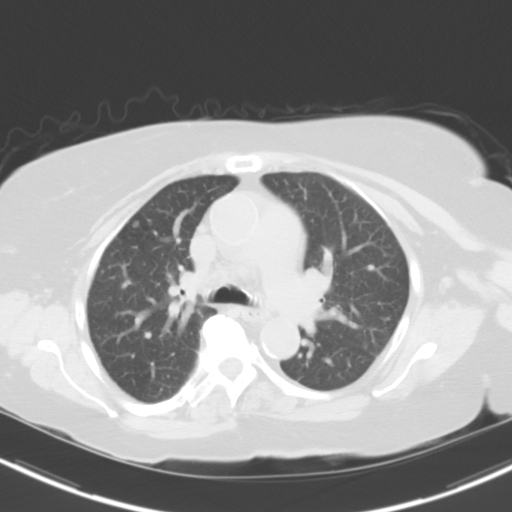
[im 41/59  mediastinal]
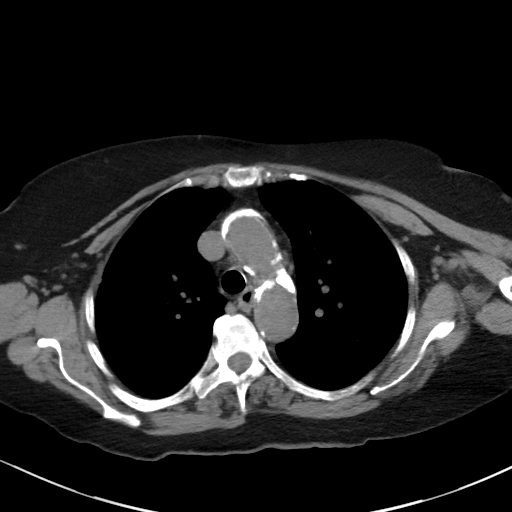
[im 41/59  lung]
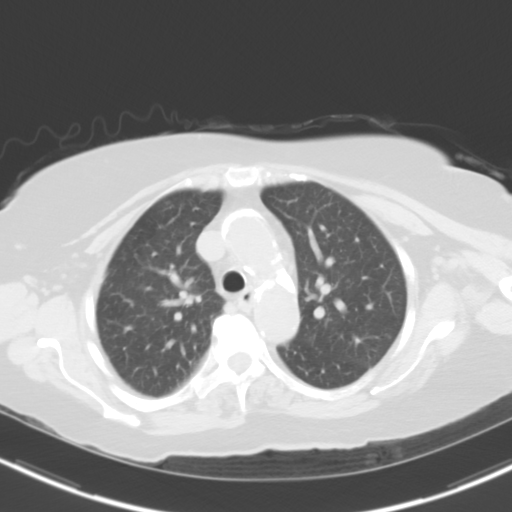
[im 46/59  lung]
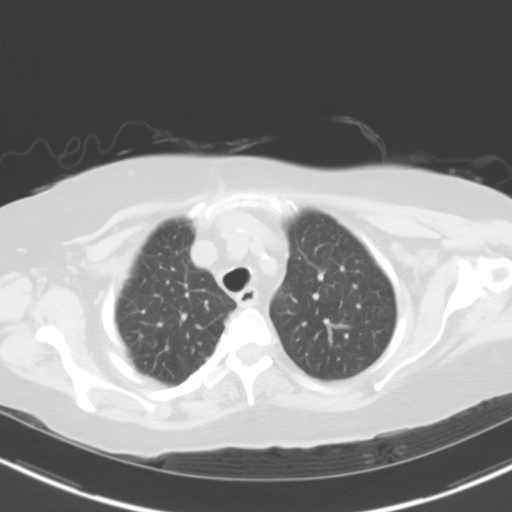
[im 50/59  lung]
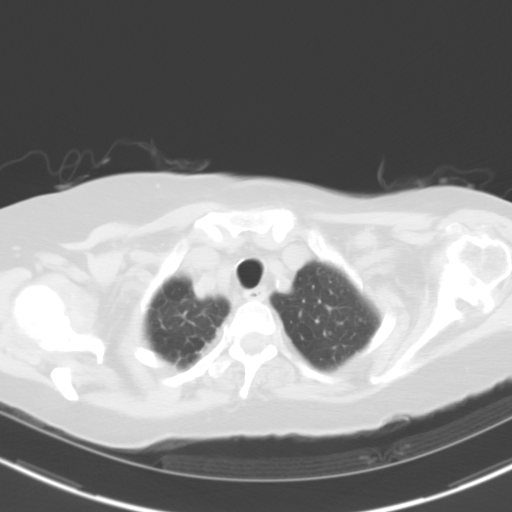
[im 54/59  lung]
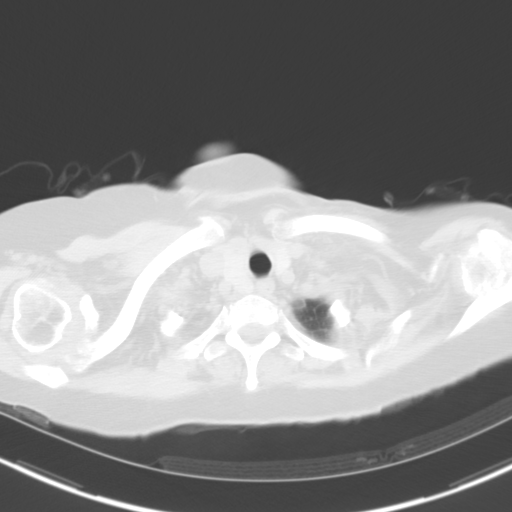

[Series 5: cor routine chest wo · coronal · 0.59mm/px · 3 of 130 slices shown]
[im 26/130  lung]
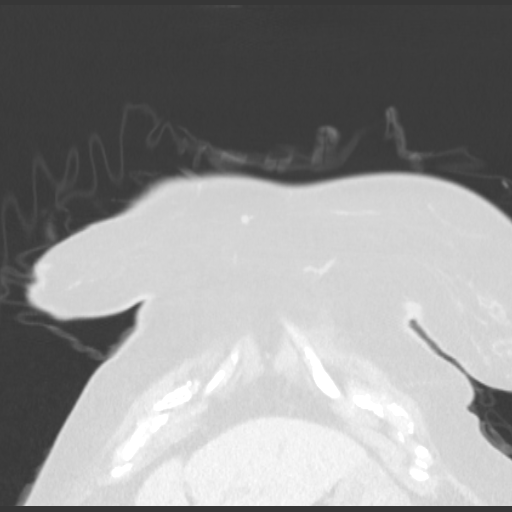
[im 52/130  lung]
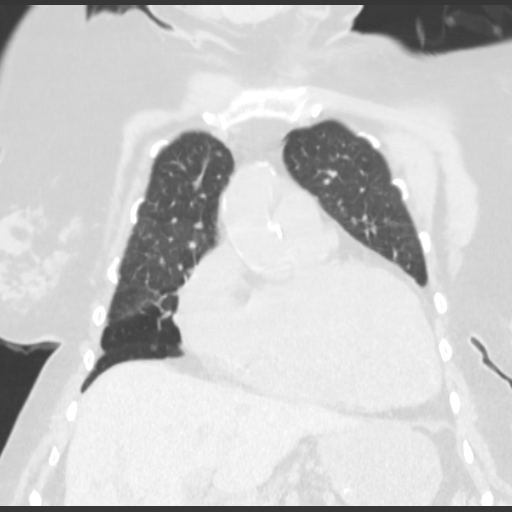
[im 78/130  lung]
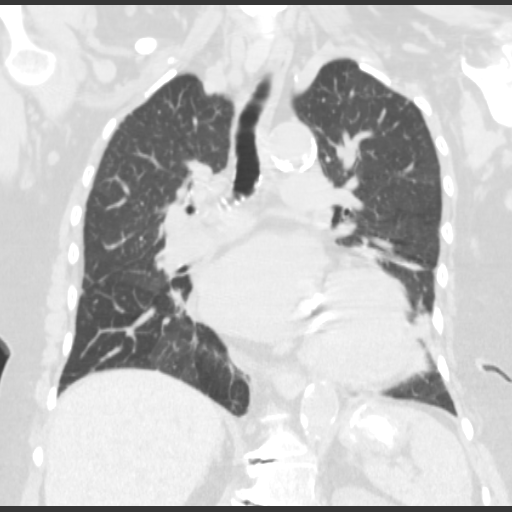

[15 of 36 positions shown; findings below may reference images not displayed]

FINDINGS: There is cardiomegaly. Mitral valve annular calcifications, coronary
artery calcifications and aortic calcifications. No evidence of
aneurysm.

2 mm nodule in the right upper lobe posteriorly on image 13 is
stable. 3 mm nodule laterally in the right upper lobe on same image
is stable. 4 mm nodule in the anterior right upper lobe on image 23
is stable. Small clustered subpleural nodules in the inferior right
upper lobe and adjacent right lower lobe are stable.

Small nodule in the posterior left upper lobe on image 11 is stable.
Small nodule in the superior segment of the left lower lobe on image
17 is stable. No new or enlarging pulmonary nodules.

Scarring in the right middle lobe and lingula are stable. No pleural
effusions. No mediastinal, hilar, or axillary adenopathy. Chest wall
soft tissues are unremarkable. Imaging into the upper abdomen shows
no acute findings.

No acute bony abnormality or focal bone lesion.
IMPRESSION: Stable small bilateral pulmonary nodules. These have been stable
since Tuesday July, 2012 and are most compatible with benign nodules. No
additional follow-up necessary.

Stable scarring in the anterior lingula and right middle lobe.

Stable cardiomegaly, coronary artery disease.

## 2015-03-29 NOTE — H&P (Signed)
PATIENT NAME:  Dawn Ellis, Dawn Ellis MR#:  [id number] DATE OF BIRTH:  1941-05-31  DATE OF ADMISSION:  10/29/2012  REFERRING PHYSICIAN: Dr. Glenetta Hew.  PRIMARY CARE PHYSICIAN: Dr. Dan Humphreys.  CARDIOLOGIST: Dr. Lady Gary. PULMONOLOGIST: Dr. Meredeth Ide.   CHIEF COMPLAINT: Shortness of breath.  HISTORY OF PRESENT ILLNESS: The patient is a pleasant 74 year old Caucasian female with history of atrial fibrillation, long hospitalization last year during which the patient had to be intubated and developed ventilator associated pneumonia, Methicillin Resistant Staphylococcus Aureus, Klebsiella and congestive heart failure. The patient states that overall she is doing well and has had no hospitalization since then. The patient came in with two days of shortness of breath, dry cough and wheezing. She had seen her PCPs office and they had prescribed her on azithromycin and prednisone which she filled today but has not taken yet, but presented to the ER with acute worsening acute respiratory failure and she was significantly hypoxic on arrival with sats of 82%, systolic blood pressure of 225/104 diastolic and respiratory rate of 32. BiPAP was started and the patient was given a dose of Solu-Medrol and levofloxacin. The patient feels better now, however, is still on BiPAP, hypoxic and needs BiPAP at this point. She is on 35% FiO2. Pressures are somewhat better, but not significantly and x-ray of the chest shows congestive heart failure with pulmonary edema. She denies having any or chest pains or sick contacts or recent antibiotics otherwise. Hospitalist services were contacted for further evaluation and management.   PAST MEDICAL HISTORY:  1. Hypertension. 2. Hyperlipidemia. 3. Fibrocystic disease of the breast. 4. Uterine fibroids.  5. History of atrial fibrillation. 6. Obesity. 7. Congestive heart failure.  8. History of ventilator associated pneumonia.   PAST SURGICAL HISTORY:    1. Laparoscopic  cholecystectomy. 2. Cataract surgeries.   ALLERGIES: None.   MEDICATIONS: Unable to fully obtain as the patient does not know them and the pharmacy is closed at this time, but currently on: 1. Ambien 5 mg daily.  2. Amiodarone 400 mg daily. This is questionable. 3. Aspirin 325 mg daily, questionable. 4. Ativan 0.5 mg 3 times a day.  5. Azithromycin 500 mg daily day one, which she has not taken yet.  6. Coreg 3.125 mg 2 times a day.  7. Combivent 1 puff inhaled 4 times a day. 8. Digoxin 250 micrograms, questionable, daily.  9. Furosemide 40 mg 3 times a day, questionable. 10. Klor-Con 1 tab 2 times a day 20 mEq. 11. Lisinopril 20 mg 1 tab daily, questionable. 12. Omeprazole, questionable.  13. Prednisone taper started today.   REVIEW OF SYSTEMS: CONSTITUTIONAL: No fever as an outpatient but fever noted here. Positive for weakness and some weight gain. EYES: No blurry vision or double vision. ENT: No tinnitus, some sore throat. No postnasal drip. RESPIRATORY: Positive for dry cough. Positive for wheezing and shortness of breath and last year she had pneumonia. CARDIOVASCULAR: No chest pain. Positive for orthopnea. She sleeps in a recliner which she attributes to her bursitis in the hips but also states that she gets short of breath if she lays flat and this has been going on for multiple years, swelling in the legs also. Positive for atrial fibrillation and dyspnea on exertion and shortness of breath. GASTROINTESTINAL: No nausea, vomiting, diarrhea, or abdominal pain or hematemesis. GENITOURINARY: Denies dysuria, hematuria, or frequency. HEMATOLOGIC: Denies anemia or easy bruising. SKIN: Denies any new rashes. MUSCULOSKELETAL: Denies arthritis or gout. NEUROLOGIC: Denies numbness, weakness. PSYCHIATRIC: Denies anxiety or insomnia.  PHYSICAL EXAMINATION:  VITAL SIGNS: Last temperature was 100.7, pulse rate 86, respiratory rate 27 when Ellis was in the room, last blood pressure 197/76, oxygen  saturation 98% on BiPAP, sating 10/5, FiO2 of 35%.   GENERAL: Currently the patient is awake, alert, and oriented x3, lying in bed, talking in full sentences, appears somewhat short of breath, however.   HEENT: Normocephalic, atraumatic. Pupils are equal and reactive. Anicteric sclerae. Ellis am unable to evaluate mucous membranes. BiPAP is on.   NECK: Unable to ascertain JVD or thyroid tenderness or lymphadenopathy given the BiPAP on. It is supple.   LUNGS: Bilateral wheezing more on the right than the left, some rales at the right side as well, mostly at the base. No significant crackles, but decreased breath sounds as well.   ABDOMEN: Soft, nontender, nondistended. Positive bowel sounds in all quadrants. No organomegaly appreciated.   CARDIOVASCULAR: S1, S2, regular rate and rhythm. No murmurs, rubs, or gallops.   EXTREMITIES: 1+ edema to mid shins.   SKIN: No obvious rashes.   NEUROLOGIC: Cranial nerves II through XII grossly intact. Strength is 5/5 in all extremities. Sensation is intact to light touch.   PSYCH: Awake, alert, oriented x3. Cooperative, pleasant.   LABORATORY, RADIOLOGICAL AND DIAGNOSTIC DATA: BNP 933, glucose 115, BUN 14, creatinine 0.79. Anion gap 5, alkaline phosphatase 165, AST 86, ALT 64, otherwise LFTs within normal limits. Troponin negative. CK-MB within normal limits x1. WBC 11.4, hemoglobin 13.4, hematocrit 39.9. Urinalysis is not suggestive of infection. X-ray of the chest: Congestive heart failure with pulmonary edema. Superimposed pneumonia cannot be excluded. EKG: Normal sinus rhythm, possible left atrial enlargement, left axis deviation.   ASSESSMENT AND PLAN: We have a 74 year old Caucasian female with history of Methicillin Resistant Staphylococcus Aureus and Klebsiella pneumonia during intubation last year, atrial fibrillation and congestive heart failure, hypertension, and obesity who presents with acute respiratory failure, hypoxic, tachypneic and  hypertensive emergency. She has improved on BiPAP with Solu-Medrol and Levaquin. This is likely secondary to acute flash edema and acute systolic congestive heart failure. Of note, the last echocardiogram here was noted to have ejection fraction of about 20% as of 12/2010. The patient is on Lasix as an outpatient and she states that her cardiologist said that as long as she takes her fluid pills, she will not be in congestive heart failure. It is unclear if she is on amiodarone or digoxin. Ellis also cannot exclude pneumonia. The patient has had a sore throat, cough and fever here and mild leukocytosis. Ellis will start the patient on Levaquin, obtain a sputum culture and rapid influenza culture as well. In regards to the congestive heart failure, Ellis would start her on Lasix IV b.Ellis.d., check ins and outs and Ellis will start her on morphine, nitro paste as well as aspirin. She has no chest pains. Would obtain an echocardiogram and cardiology consult as well. Would keep the BiPAP on given how sick she was on arrival and although not back to her baseline, she still has significant wheezing, rales and is hypoxic. Blood cultures have been sent. Would check sputum cultures. She appears to be in atrial fibrillation, not on any anticoagulation. Would check to see if she is still has a depressed ejection fraction and defer to cardiology in regards to anticoagulation if it is needed. The patient also has significantly elevated blood pressure and would add Hydralazine IV p.r.n. in addition to amlodipine p.o. and continue her ACE as well as a beta blocker. The patient will  also be started on heparin for deep vein thrombosis prophylaxis.   CRITICAL CARE TIME SPENT: 65 minutes.   ____________________________ Krystal Eaton, MD sa:ap D: 10/29/2012 22:29:39 ET T: 10/30/2012 07:33:17 ET JOB#: [id number]  cc: Krystal Eaton, MD, <Dictator> John B. Danne Harbor, MD Darlin Priestly Lady Gary, MD Herbon E. Meredeth Ide, MD Krystal Eaton  MD ELECTRONICALLY SIGNED 11/20/2012 11:09

## 2015-03-29 NOTE — Consult Note (Signed)
Brief Consult Note: Diagnosis: progressive shortness of breath.   Patient was seen by consultant.   Consult note dictated.   Recommend further assessment or treatment.   Comments: 74 yo female with history of afib currently in nsr on amiodarone with history of prolonged hospitalization last year after presenting with chf requiring intubation compliated by ventilator pneumonia and prolong intubation. She now presents with progressive shortness of breath. CXR sugggested chf. She has improved with bronchodilators, abx and diuresis. EKG appears to show sr. Would continue to diureses and treat bronchospasm. Echo revealed preserved lv funciton with evidence of diastollic dysfunction suggestive of preserved ef chf. Would continue amidoarone. Would defer chronic anticoagulation at preent as pt does not appear to have afib. Her CHADSS score is 3. Full note to follow.  Electronic Signatures: Dalia HeadingFath, Keison Glendinning A (MD)  (Signed 21-Nov-13 19:51)  Authored: Brief Consult Note   Last Updated: 21-Nov-13 19:51 by Dalia HeadingFath, Pura Picinich A (MD)

## 2015-03-29 NOTE — Discharge Summary (Signed)
PATIENT NAME:  Dawn Ellis, Dawn Ellis MR#:  [id number] DATE OF BIRTH:  06/21/1941  DATE OF ADMISSION:  10/29/2012 DATE OF DISCHARGE:  10/31/2012  HISTORY OF PRESENT ILLNESS:  The patient was a 74 year old white lady with a history of heart disease and pulmonary disease who presented to the Emergency Room with a 2-day history of increasing shortness of breath. She had been seen recently in the office and prescribed Zithromax plus a prednisone taper which she had had filled but had not started taking. In the Emergency Room, she was found to have a pulse ox of 82% on room air and blood pressure of 225/104. Respiratory rate was 32. She was started on BiPAP and admitted for further evaluation. Chest x-ray in the Emergency Room showed congestive heart failure and possible pulmonary edema.   PAST MEDICAL HISTORY:  Notable for hypertension, hyperlipidemia, a history of atrial fibrillation, a history of congestive heart failure and obesity.   PAST SURGICAL HISTORY:  Included a previous laparoscopic cholecystectomy. She had also had bilateral cataract extractions.   ALLERGIES:  The patient had no known drug allergies.   MEDICATIONS:  The patient's admission medications as described by the admitting physician were incomplete.   PHYSICAL EXAMINATION: ADMISSION VITAL SIGNS:  Temperature 100.7, pulse of 86 and blood pressure of 197/76.  GENERAL:  The patient was alert and oriented x 3. Her color was good on oxygen. She was noted to have bilateral wheezing and some rales. She had no significant crackles but did have decreased breath sounds.  CARDIAC:  Regular rhythm without murmur or gallop. She was noted to have 1+ edema.   LABORATORY DATA:  Admission CBC showed a hemoglobin of 13.4 with a hematocrit of 39.9. Platelet count was 210,000. White count was 11,400. Admission comprehensive metabolic panel was notable for a random blood sugar of 115. Alkaline phosphatase was 165. SGOT was 86. Dig level was 1.19. BNP was 933.  Magnesium was 1.9. The troponin on admission was normal. Admission urinalysis showed 100 mg per dL of protein but was otherwise unremarkable. Blood cultures obtained on admission showed no growth. Urine culture eventually grew out Escherichia coli. Screening for influenza A and B was negative. Admission EKG showed a normal sinus rhythm with left bundle branch block. Echocardiogram showed the left atrium to be mildly dilated. Ejection fraction was greater than 55%. There was impaired left ventricular relaxation. Valvular disease was not noted to be present. Admission chest x-ray showed congestive heart failure with pulmonary edema. Superimposed pneumonia cannot be excluded.   HOSPITAL COURSE:  The patient was started on BiPAP in the Emergency Room as noted above. She was admitted to the regular medical floor where she was placed on telemetry. She was treated for suspected COPD flare with IV antibiotics and IV steroids. She was also given additional diuretics for volume overload. The patient had a rather rapid response to her treatment and within 24 to 48 hours was totally asymptomatic. She was ambulated without difficulty. She was tolerating her diet well.   DISCHARGE DIAGNOSIS:  Acute respiratory failure due to pulmonary edema and acute bronchitis.   DISCHARGE DISPOSITION:  The patient was discharged on her preadmission medications without change. She was discharged on Augmentin 875 mg twice daily for an additional 5 days. She was also placed on a prednisone taper which she already had at home. She was not requiring supplemental oxygen at the time of discharge. She was discharged on a low sodium diet with activity as tolerated. The patient already had  an appointment for the following week routinely which she was asked to keep in followup.     ____________________________ Letta Pate. Danne Harbor, MD jbw:es D: 11/24/2012 06:26:44 ET T: 11/25/2012 09:12:41 ET JOB#: [id number]  cc: Jonny Ruiz B. Danne Harbor, MD,  <Dictator> Elmo Putt III MD ELECTRONICALLY SIGNED 11/26/2012 7:27

## 2015-04-01 NOTE — Discharge Summary (Signed)
PATIENT NAME:  Dawn Ellis, Dawn I MR#:  [id number] DATE OF BIRTH:  08-04-1941  DATE OF ADMISSION:  01/05/2013 DATE OF DISCHARGE:  01/13/2013  HISTORY OF PRESENT ILLNESS: The patient is a 74 year old white lady with a past history of hypertension, hyperlipidemia, congestive heart failure and COPD, who presented complaining of shortness of breath, cough, and productive sputum. She states she had had 3 flares of COPD since Thanksgiving. She most recently had been on Augmentin and a prednisone taper. In the Emergency Room, she was found to be hypoxic on room air with an oxygen saturation of 78%. She was also noted to have significant wheezing. She was found to have an elevated BNP. Chest x-ray showed some evidence of vascular congestion. She was started on supplemental oxygen, IV steroids and given IV Lasix in the Emergency Room. The patient was admitted for further evaluation and treatment.   PAST MEDICAL HISTORY: Notable for hypertension, hyperlipidemia, history of atrial fibrillation, chronic obesity, previous congestive heart failure, previous admission for a ventilator associated pneumonia and chronic obstructive pulmonary disease.   PAST SURGICAL HISTORY: Included bilateral cataract surgery. She also had a laparoscopic cholecystectomy.   ALLERGIES: No known drug allergies.   MEDICATIONS ON ADMISSION: Included lisinopril 40 mg daily, amiodarone 20 mg daily, digoxin 0.125 mg daily, aspirin 325 mg daily, Ambien 5 mg at bedtime p.r.n., Ativan 0.5 mg t.i.d. p.r.n., Combivent 2 puffs p.r.n., Lasix 40 mg daily, potassium chloride 20 mEq daily and omeprazole 40 mg daily.   ADMISSION VITAL SIGNS: Showed a temperature of 98.4, pulse 86, respirations 24, blood pressure 146/76. Pulse oximetry was 95% on 2 L. Examination as described by the admitting physician was most notable for bibasilar crackles with diffuse wheezing on pulmonary exam. She was noted to be in a regular rhythm. She had mild pitting edema. There  was no JVD. The remainder of the examination was basically unremarkable.   LABORATORY DATA: Admission CBC showed a hemoglobin of 14.4 with a hematocrit of 43.3. White count was 16,000. Platelet count was 290,000. Admission comprehensive metabolic panel showed a BUN of 26 with a creatinine of 1.15. Electrolytes were normal. CO2 was 33. Estimated GFR was 47. Troponin was normal. BNP was 2268. Blood cultures drawn on admission eventually showed no growth. EKG showed a normal sinus rhythm with left axis deviation and left bundle branch block. Admission chest x-ray showed stable cardiomegaly. Prominent interstitial markings were noted.   HOSPITAL COURSE: The patient was admitted to the regular medical floor where she was placed on telemetry. She was placed on a pulmonary toilet of IV antibiotics, IV steroids and SVNs. She was seen in consultation by her cardiologist, who felt that she had diastolic congestive heart failure probably related to her primary pulmonary event. The patient was also seen in consultation by pulmonary. She was making a slow and gradual improvement and, in fact, was being considered for discharge when she developed lower abdominal cramping and GI bleeding. She was seen in consultation by gastroenterology and eventually underwent colonoscopy, which was consistent with ischemic colitis. She was treated conservatively and the bleeding stopped. She did have a GI bleeding scan that was negative. Her hemoglobin at the time of discharge was 11.2 with a hematocrit of 33.8. White count remained elevated at 19,500. It was unclear whether this was due to steroids or due to her ischemic bowel. At the time of discharge, however, she had no further bleeding and her abdomen was soft. She was also tolerating her diet.   DISCHARGE DIAGNOSES:  1.  Acute respiratory failure secondary to chronic obstructive pulmonary disease flare.  2.  Acute diastolic congestive heart failure due to respiratory failure.  3.   Acute gastrointestinal blood loss due to ischemic colitis.   DISCHARGE MEDICATIONS:  1.  Aspirin 325 mg daily.  2.  Ambien 5 mg at bedtime.  3.  Combivent 1 puff 4 times a day.  4.  Klor-Con 20 mEq b.i.d.  5.  Omeprazole 40 mg daily.  6.  Amiodarone 200 mg daily.  7.  Ativan 0.5 mg every 6 hours as needed.  8.  Carvedilol 12.5 mg b.i.d.  9.  Digoxin 0.125 mg daily.  10. Furosemide 40 mg daily.  11. Lisinopril 40 mg daily.  12. Prednisone taper.  13. Budesonide/formoterol 80/4.5 mcg 2 puffs b.i.d.   The patient was discharged on a low sodium diet with activity as tolerated. She was discharged on home oxygen at 2 L/min via nasal cannula. She will also have a portable tank.   The patient is to follow up with her pulmonologist, Dr. Meredeth Ide in 1 to 2 weeks.   ____________________________ Letta Pate Danne Harbor, MD jbw:aw D: 01/27/2013 13:11:35 ET T: 01/27/2013 13:34:45 ET JOB#: [id number]  cc: Jonny Ruiz B. Danne Harbor, MD, <Dictator> Elmo Putt III MD ELECTRONICALLY SIGNED 01/28/2013 7:35

## 2015-04-01 NOTE — Consult Note (Signed)
Chief Complaint:   Subjective/Chief Complaint Multiple bloody bowel movements after starting bowel prep. Still with some LLQ abd pain. No signif drop in hgb though. Bleeding scan neg.   VITAL SIGNS/ANCILLARY NOTES: **Vital Signs.:   31-Jan-14 04:05   Vital Signs Type Routine   Temperature Temperature (F) 98.5   Celsius 36.9   Temperature Source Oral   Pulse Pulse 75   Respirations Respirations 20   Systolic BP Systolic BP 156   Diastolic BP (mmHg) Diastolic BP (mmHg) 78   Mean BP 104   Pulse Ox % Pulse Ox % 98   Pulse Ox Activity Level  At rest   Oxygen Delivery 2L   Brief Assessment:   Cardiac Regular    Respiratory clear BS    Gastrointestinal mild tenderness in LLQ   Lab Results: Routine Chem:  31-Jan-14 03:04    Result Comment CBC - AUTO DIFF IS CONSISTENT...TPL  Result(s) reported on 09 Jan 2013 at 04:08AM.  Routine Hem:  31-Jan-14 03:04    WBC (CBC)  23.8   RBC (CBC) 5.16   Hemoglobin (CBC) 15.9   Hematocrit (CBC)  47.8   Platelet Count (CBC) 304   MCV 93   MCH 30.8   MCHC 33.3   RDW 14.3   Neutrophil % 83.2   Lymphocyte % 7.1   Monocyte % 9.4   Eosinophil % 0.1   Basophil % 0.2   Neutrophil #  19.8   Lymphocyte # 1.7   Monocyte #  2.2   Eosinophil # 0.0   Basophil # 0.0   Radiology Results: Nuclear Med:    30-Jan-14 15:40, GI Blood Loss Study - Nuc Med   GI Blood Loss Study - Nuc Med    REASON FOR EXAM:    Active GI bleed  COMMENTS:       PROCEDURE: NM  - NM GI BLOOD LOSS STUDY  - Jan 08 2013  3:40PM     RESULT: The patient's blood pool was labeled using 22.655 mCi of   technetium [id number] labeled pertechnetate with 3 mL of pyrophosphate.    There is normal expected activity in the heart and liver and likely some   within the spleen. Activity within the stomach may reflect free   technetium. This activity does not migrate. I do not see abnormal   activity along the expected course ofthe colon.    IMPRESSION:   1. There is no objective  evidence of active gastrointestinal bleeding.  2. There is considerable activity which likely lies in the stomach and   may be related to free technetium.     Dictation Site: 2        Verified By: DAVID A. SwazilandJORDAN, M.D., MD   Assessment/Plan:  Assessment/Plan:   Assessment LGI bleeding. Ischemic colitis. Diverticulitis? though no mention of diverticulosis in previous colonoscopy.    Plan For colonoscopy later this AM. thanks.   Electronic Signatures: Lutricia Feilh, Laneya Gasaway (MD)  (Signed 31-Jan-14 07:38)  Authored: Chief Complaint, VITAL SIGNS/ANCILLARY NOTES, Brief Assessment, Lab Results, Radiology Results, Assessment/Plan   Last Updated: 31-Jan-14 07:38 by Lutricia Feilh, Talulah Schirmer (MD)

## 2015-04-01 NOTE — Consult Note (Signed)
Brief Consult Note: Diagnosis: Acute Respiratory Failure. COPD.  Acute active lower GI bleed.  History of atrial fibrillation.   Consult note dictated.   Recommend further assessment or treatment.   Orders entered.   Discussed with Attending MD.   Comments: Patient's presentation discussed with Dr. Lutricia FeilPaul Oh.  Patient was seen and evaluated by himself as well.  NPO status ordered given the evidence of active bleeding.  Order placed for GI blood loss study for this afternoon.  If positive results will recommend proceeding with vascular surgery consultation.  If negative study will place patient on clear liquid diet and prepare to prep her for diagnostic colonoscopy tomorrow.  ASA being held.  CBC every eight hours ordered for serial monitoring.  Transfuse as necessary.  Addendum:  17:20 Discussed with Dr. Lutricia FeilPaul Oh findings of GI blood loss study.  Negative results.  Order placed on colonic prep.  Scheduled for colonoscopy tomorrow.  Order placed.    Rodman Keyawn S. Shaunae Sieloff FNP-BC.  Electronic Signatures: Rodman KeyHarrison, Maram Bently S (NP)  (Signed 30-Jan-14 17:27)  Authored: Brief Consult Note   Last Updated: 30-Jan-14 17:27 by Rodman KeyHarrison, Kitti Mcclish S (NP)

## 2015-04-01 NOTE — Consult Note (Signed)
Chief Complaint:   Subjective/Chief Complaint seen for rectal bleeding.  Colonoscopy indicating probable ischemic colitis, etiology uncertain. no repeat bleeding, abdominal pain less, tolerating clears, no n/v.   VITAL SIGNS/ANCILLARY NOTES: **Vital Signs.:   01-Feb-14 11:36   Vital Signs Type Routine   Temperature Temperature (F) 98.1   Celsius 36.7   Temperature Source oral   Pulse Pulse 64   Respirations Respirations 20   Systolic BP Systolic BP 106   Diastolic BP (mmHg) Diastolic BP (mmHg) 57   Mean BP 73   Pulse Ox % Pulse Ox % 93   Pulse Ox Activity Level  At rest   Oxygen Delivery 2L   Brief Assessment:   Cardiac Regular    Respiratory clear BS    Gastrointestinal details normal Soft  Nondistended  No masses palpable  Bowel sounds normal  mild tenderness on the left abdomen.   Lab Results: Routine Chem:  01-Feb-14 04:22    Result Comment CBC - SMEAR SCANNED PLATELET - SLIGHT PLATELET CLUMPING IN SPECIMEN. ACTUAL  - NUMERICAL COUNT MAY BE SOMEWHAT HIGHER THAN  - THE REPORTED VALUE.  Result(s) reported on 10 Jan 2013 at 06:13AM.  Routine Hem:  27-Jan-14 00:23    Hemoglobin (CBC) 14.4  28-Jan-14 03:59    Hemoglobin (CBC) 14.4  30-Jan-14 06:21    Hemoglobin (CBC) 15.2    09:37    Hemoglobin (CBC) 14.3 (Result(s) reported on 08 Jan 2013 at 09:56AM.)    19:39    Hemoglobin (CBC)  16.5  31-Jan-14 03:04    Hemoglobin (CBC) 15.9    14:23    Hemoglobin (CBC) 13.1  01-Feb-14 04:22    WBC (CBC)  29.9   RBC (CBC) 3.97   Hemoglobin (CBC) 12.3   Hematocrit (CBC) 36.9   Platelet Count (CBC) 190   MCV 93   MCH 31.0   MCHC 33.4   RDW 14.4   Neutrophil % 84.9   Lymphocyte % 7.5   Monocyte % 7.4   Eosinophil % 0.1   Basophil % 0.1   Neutrophil #  25.4   Lymphocyte # 2.2   Monocyte #  2.2   Eosinophil # 0.0   Basophil # 0.0   Assessment/Plan:  Assessment/Plan:   Assessment 1) ischemic colitis-uncertain etiology. 2) acute respiratory failure/copd  exacerbation    Plan 1) will order antithrombin III, leiden factor V, anticardiolipin abs, protein c+s to rule out coagulopathy. 2) may need CTA to evaluate mesenteric takeoffs.  following.   Electronic Signatures: Barnetta ChapelSkulskie, Martin (MD)  (Signed 01-Feb-14 15:43)  Authored: Chief Complaint, VITAL SIGNS/ANCILLARY NOTES, Brief Assessment, Lab Results, Assessment/Plan   Last Updated: 01-Feb-14 15:43 by Barnetta ChapelSkulskie, Martin (MD)

## 2015-04-01 NOTE — Consult Note (Signed)
Chief Complaint:   Subjective/Chief Complaint seen for hematochezia/abdominal pain and finding on colonoscopy of ischemic colitis.  less abdominal pain, no overt rectal bleeding, some flecks of old bleed on passing flatus.  no bm today.   VITAL SIGNS/ANCILLARY NOTES: **Vital Signs.:   02-Feb-14 08:01   Vital Signs Type Routine   Temperature Temperature (F) 97.9   Celsius 36.6   Temperature Source oral   Pulse Pulse 69   Respirations Respirations 20   Systolic BP Systolic BP 103   Diastolic BP (mmHg) Diastolic BP (mmHg) 58   Mean BP 97   Pulse Ox % Pulse Ox % 93   Pulse Ox Activity Level  At rest   Oxygen Delivery 2L   Brief Assessment:   Cardiac Regular    Respiratory wheezing    Gastrointestinal details normal Soft  Nondistended  No masses palpable  Bowel sounds normal  mild left abd discomfort toward flank.   Lab Results: Routine Chem:  02-Feb-14 07:12    Glucose, Serum 86   BUN  21   Creatinine (comp) 0.66   Sodium, Serum 137   Potassium, Serum 3.5   Chloride, Serum 98   CO2, Serum  34   Calcium (Total), Serum  8.2   Anion Gap  5   Osmolality (calc) 276   eGFR (African American) >60   eGFR (Non-African American) >60 (eGFR values <58m/min/1.73 m2 may be an indication of chronic kidney disease (CKD). Calculated eGFR is useful in patients with stable renal function. The eGFR calculation will not be reliable in acutely ill patients when serum creatinine is changing rapidly. It is not useful in  patients on dialysis. The eGFR calculation may not be applicable to patients at the low and high extremes of body sizes, pregnant women, and vegetarians.)  Routine Hem:  02-Feb-14 07:12    WBC (CBC)  25.4   RBC (CBC)  3.71   Hemoglobin (CBC)  11.5   Hematocrit (CBC)  34.6   Platelet Count (CBC) 179   MCV 93   MCH 31.0   MCHC 33.3   RDW 14.1   Neutrophil % 83.4   Lymphocyte % 9.5   Monocyte % 6.5   Eosinophil % 0.4   Basophil % 0.2   Neutrophil #  21.2    Lymphocyte # 2.4   Monocyte #  1.7   Eosinophil # 0.1   Basophil # 0.0 (Result(s) reported on 11 Jan 2013 at 07:39AM.)   Assessment/Plan:  Assessment/Plan:   Assessment 1) lower Gi bleed, colonoscopy c/w ischemic colitis.  stable, no repeat bleeding.  multiple labs ordered to evaluate for coagulopaty d/o.  May need CTA as feasible to evaluate mesenteric takeoffs versus mesenteric dopplers.    Plan 1) no new Gi recs, Dr OCandace Cruiseto return tomorrow.   Electronic Signatures: SLoistine Simas(MD)  (Signed 02-Feb-14 15:54)  Authored: Chief Complaint, VITAL SIGNS/ANCILLARY NOTES, Brief Assessment, Lab Results, Assessment/Plan   Last Updated: 02-Feb-14 15:54 by SLoistine Simas(MD)

## 2015-04-01 NOTE — Consult Note (Signed)
Pt seen and examined. Please see Dawn Harrison's notes. Breathing better but still on O2. Gross rectal bleeding after taking prune juice/miralax. LLQ tenderness. Poss ischemic colitis. Ordered bleeding scan for today. If positve, then refer to vascular surgery for poss embolization. Moniter hgb closely. If scan neg, then bowel prep tonight for possible colonscopy tomorrow AM if breathing stable. Please hold ASA/heparin SQ. Thanks.   Electronic Signatures: Lutricia Feilh, Rockwell Zentz (MD) (Signed on 30-Jan-14 15:14)  Authored   Last Updated: 30-Jan-14 15:16 by Lutricia Feilh, Mia Milan (MD)

## 2015-04-01 NOTE — Consult Note (Signed)
General Aspect 74 year old female who presented with respiratory distress after the patient states she has been fighting an upper respiratory infection since November.  She was on her third round of prednisone and her second round of antibiotics.  Her breathing worsened to the point that she thought she needed additional help.  She was given Lasix five-day due to chest x-ray showing possible pulmonary congestion.  Chest x-ray does show prominent interstitial markings.  BNP is 2268.  Troponin has been negative.  She does have a history of atrial fibrillation maintaining normal sinus rhythm on amiodarone.  Her last echocardiogram was in March of 2011 and showed EF of 45% with mild LV dysfunction mild left ventricular percutaneous mild to moderate mitral insufficiency.  Patient does have Lasix that she uses as needed to control the fluid.  She does have at times, mild pedal edema.  Since being on prednisone she had contributed extra fluid to the use of this drug.  She denies any chest pain.  Her last stress test was April 2011 with no evidence of ischemia,  however patient had difficulty tolerating procedure due to claustrophobia.Feels improved today. TYhe patient does have history of pneumonia and respiratory distress that required intubation in February 2012. No acute changes by EKG.   Physical Exam:   GEN well developed, no acute distress    HEENT pink conjunctivae    RESP normal resp effort  clear BS    CARD Regular rate and rhythm  Normal, S1, S2    ABD denies tenderness    EXTR negative edema    SKIN normal to palpation    NEURO cranial nerves intact, motor/sensory function intact    PSYCH alert, A+O to time, place, person, good insight   Review of Systems:   Subjective/Chief Complaint Difficulty breathing    Respiratory: Frequent cough  Short of breath  Wheezing    Cardiovascular: No Complaints    Review of Systems: All other systems were reviewed and found to be negative    Radiology Results: XRay:    26-Jan-14 23:21, Chest Portable Single View   Chest Portable Single View    PRELIMINARY REPORT    The following is a PRELIMINARY Radiology report.  A final report will follow pending radiologist verification.      REASON FOR EXAM:    Shortness of breath  COMMENTS:       PROCEDURE: DXR - DXR PORTABLE CHEST SINGLE VIEW  - Jan 04 2013 11:21PM     RESULT: Comparison is made to the study of 29 October 2012. The cardiac   silhouette is enlarged. There is a band of density in the right lower   lung which could represent a prominent rib shadow. Follow-up PA and   lateral views are recommended. Mild interstitial prominence is seen.   There is slight peribronchial thickening. Correlate for interstitial   infiltrate of an edematous or nonedematous etiology. There is no discrete   mass or definite effusion.    IMPRESSION:    1. Stable cardiomegaly. Atherosclerotic disease is present.  2. Prominent interstitial markings. Follow-up PA and lateral views are   recommended.    Thank you for the opportunity to contribute to the care of your patient.     Dictation Site: 1        Dictated By: Elveria Royals, M.D., MD    No Known Allergies:   Vital Signs/Nurse's Notes: **Vital Signs.:   27-Jan-14 11:19   Temperature Temperature (F) 97.9  Celsius 36.6   Temperature Source oral   Pulse Pulse 81   Respirations Respirations 21   Systolic BP Systolic BP 134   Diastolic BP (mmHg) Diastolic BP (mmHg) 52   Mean BP 79   Pulse Ox % Pulse Ox % 94   Pulse Ox Activity Level  At rest   Oxygen Delivery 4L     Impression 74 year old female with history of COPD with 3 week history of persistent upper respiratory symptoms not responding to prednisone or antibiotics with mild interstitial findings on chest x-ray improved with mild diuresis possibly acute on chronic diastolic dysfunction exacerbated by recent upper respiratory infection and subsequent treatment of  same.    Plan 1.  Surface echocardiogram to look for any additional LV dysfunction, valvular insufficiency that is contributing to current symptoms. 2.  Continue current plan of care. 3.  Further recommendations per Dr. Gwen PoundsKowalski.   Electronic Signatures for Addendum Section:  Rudi CocoCarroll, Donna (NP) (Signed Addendum 27-Jan-14 14:27)  Discussed with Dr. Gwen PoundsKowalski amiodarone use in patient's lung issues and he recommended that amiodarone be stopped if there is concern that it is worsening her lung function.   Electronic Signatures: Rudi Cocoarroll, Donna (NP)  (Signed 27-Jan-14 14:13)  Authored: General Aspect/Present Illness, History and Physical Exam, Review of System, Home Medications, Radiology, Allergies, Vital Signs/Nurse's Notes, Impression/Plan   Last Updated: 27-Jan-14 14:27 by Rudi Cocoarroll, Donna (NP)

## 2015-04-01 NOTE — H&P (Signed)
PATIENT NAME:  Dawn Ellis, Dawn Ellis MR#:  [id number] DATE OF BIRTH:  08/14/1941  DATE OF ADMISSION:  01/04/2013  PRIMARY CARE PHYSICIAN: Dr. Yates Decamp.   REFERRING PHYSICIAN: Dr. Chiquita Loth.   CHIEF COMPLAINT: Shortness of breath, cough, productive sputum.   HISTORY OF PRESENT ILLNESS: This is a 74 year old female with significant past medical history of hypertension, hyperlipidemia, congestive heart failure with last echo showing ejection fraction of 55%, with abnormal relaxation, with what appears to be COPD as her last lung study showing obstructive and restrictive picture, even though she had no history of smoking. The patient presents with shortness of breath, cough, productive sputum. The patient reports she had similar episodes over the last few months. She reports since Thanksgiving, she had 3 flares where she was given p.o. antibiotics and prednisone by her PCP. The patient reports she had progressive shortness of breath, mainly exertional, as well as cough started to become productive, mainly white in color, where she was prescribed Augmentin and prednisone on Thursday by her PCP, where she did not notice much improvement which prompted her to come to the ED. The patient was found to be hypoxic by paramedics, saturating 78% on room air. The patient does not use any home oxygen. The patient had significant wheezing for which she was given IV Solu-Medrol and Levaquin in the ED and aggressive treatment which improved her breathing. The patient had elevated proBNP, as well as her chest x-ray did show evidence of vascular congestion, where she was given 40 of IV Lasix, where her symptoms improved in the ED after significant diuresis as well. The patient denies any chest pain, any fever or chills, but complains of generalized weakness and fatigue. Denies any abdominal pain, coffee-ground emesis, nausea, vomiting, diarrhea or constipation. The patient had negative troponin. Her EKG did not show any  significant changes from previous, where she is known to have left bundle branch block. Hospitalist services were requested to admit the patient for further management and workup of her acute respiratory failure. As well, the patient was found to have significantly elevated blood pressure with systolic blood pressure running in the 200s. As well, the patient is known to have history of atrial fibrillation, paroxysmal, but she was in normal sinus rhythm.    PAST MEDICAL HISTORY:  1. Hypertension.  2. Hyperlipidemia.  3. Fibrocystic disease of the breast.  4. Uterine fibroids.  5. History of atrial fibrillation, currently in normal sinus rhythm.  6. Obesity.  7. Congestive heart failure.  8. History of ventilator-associated pneumonia.  9. Lung disease obstructive pattern.   PAST SURGICAL HISTORY:  1. Laparoscopic cholecystectomy.  2. Cataract surgeries.   ALLERGIES: None.   HOME MEDICATIONS:  1. Prednisone tapering dose started on Thursday.  2. Lisinopril 40 mg oral daily.  3. Amiodarone 200 mg oral daily.  4. Digoxin 0.125 mg oral daily.  5. Aspirin 325 mg oral daily.  6. Ambien 5 mg at bedtime.  7. Ativan 0.5 mg oral 3 times a day as needed.  8. Combivent as needed.  9. Lasix 40 mg oral daily.  10. Potassium 20 mEq oral daily.  11. Augmentin 1 tablet b.Ellis.d. started Thursday.  12. Omeprazole 40 mg oral daily.   SOCIAL HISTORY: There is no history of smoking or alcohol use. She lives at home.   FAMILY HISTORY: Significant for lung cancer in father and sister.   REVIEW OF SYSTEMS:  CONSTITUTIONAL: The patient denies any fever or chills but complains of generalized weakness and  fatigue. No recent weight gain or weight loss.  EYES: Denies blurry vision, double vision.  EARS, NOSE, THROAT: Denies tinnitus. Has some sore throat, productive sputum. No postnasal drip.  RESPIRATORY: Significant for cough with productive yellow sputum, as well positive for wheezing and dyspnea.   CARDIOVASCULAR: Denies any chest pain, palpitations, edema, orthopnea or syncope.  GASTROINTESTINAL: Denies nausea, vomiting, diarrhea, hematemesis, melena.  GENITOURINARY: Denies hematuria, dysuria, hesitancy or incontinence.  MUSCULOSKELETAL: Denies arthralgia, myalgia, muscle weakness.  SKIN: Denies rash, pruritus or sores.  NEUROLOGIC: Denies migraine, numbness, ataxia, tremors, vertigo.  ENDOCRINE: Denies polyuria, polydipsia, heat or cold intolerance.  PSYCHIATRIC: Denies depression, anxiety, alcohol or substance abuse.  HEMATOLOGY: Denies easy bruising, bleeding diathesis or blood clots.  ALLERGY: Denies any hayfever, asthma, hives or rhinitis.   PHYSICAL EXAM:  VITAL SIGNS: Temperature 98.4, pulse 86, respiratory rate 24, blood pressure 146/76, saturating 95% on 2 liters nasal cannula.  GENERAL: Obese female, looks comfortable in bed, in no apparent distress.  HEAD, EYES, EARS, NOSE, THROAT: Head atraumatic, normocephalic. Pupils equal, reactive to light. Pink conjunctivae. Anicteric sclerae. Moist oral mucosa.  NECK: Supple. No thyromegaly. No JVD.  CHEST: Good air entry bilaterally with bibasilar crackles and diffuse wheezing.  CARDIOVASCULAR: S1, S2 heard. No rubs, murmurs or gallops.  ABDOMEN: Soft, nontender, nondistended. Bowel sounds present.  EXTREMITIES: Mild pitting edema. No clubbing. No cyanosis. Good pedal pulses bilaterally.  PSYCHIATRIC: Appropriate affect. Awake, alert x3. Intact judgment and insight.  SKIN: Normal skin turgor. Warm and dry. No rash.  NEUROLOGIC: Cranial nerves grossly intact. Motor 5 out of 5. No focal deficits.   PERTINENT LABS: Glucose 103, proBNP 2268, BUN 26, creatinine 1.15, sodium 139, potassium 4.4, chloride 100, CO2 33. Troponin less than 0.04. White blood cells 16, hemoglobin 14.4, hematocrit 43.3, platelets 290.   EKG showing normal sinus rhythm at 78 beats per minutes, with left bundle branch block which is old when compared to previous  EKG.   Chest x-ray, preliminary reading, showing not much change from previous one, with cardiomegaly, some vascular congestion and possible atelectatic finding in the right lower lobe.   ASSESSMENT AND PLAN:  1. Acute respiratory failure: This appears to be due to chronic obstructive pulmonary disease exacerbation and congestive heart failure exacerbation. Will continue the patient on oxygen.  2. Chronic obstructive pulmonary disease exacerbation: The patient has never had history of smoking or secondhand smoking, but she has been seen by Dr. Meredeth IdeFleming as an outpatient with her study showing obstructive pattern and she had significant wheezing upon presentation. The patient will be started on intravenous Solu-Medrol and nebulizer treatment and Protonix as she is on large dose of steroids. Given the fact she is having productive sputum, she will be started on Levaquin as well and will consult pulmonary, Dr. Meredeth IdeFleming.  3. Congestive heart failure: The patient's last ejection fraction was 55% but had abnormal relaxation of the heart, so most likely she is having diastolic congestive heart failure acute exacerbation. The patient will be started on diuresis. She will be given Lasix 40 mg intravenous every 12 hours. Will order a total of 3 doses and will renew if still needs further diuresis. Will have her on daily weights, strict ins and outs and will consult cardiology, Dr. Lady GaryFath. Will continue to cycle troponins and will have her on telemonitor. The patient has uncontrolled blood pressure which is very likely contributing to her congestive heart failure.  4. Hypertension, malignant, uncontrolled: Will resume the patient on her home medication. As  well, will add some p.r.n. hydralazine and she already received nitro paste in the emergency department.   5. History of atrial fibrillation: Currently, she is in normal sinus rhythm. Will continue with Coreg, digoxin and amiodarone. Will see if cardiology wants to  continue her on amiodarone giving her poor respiratory function.  6. Deep vein thrombosis prophylaxis: Subcutaneous heparin.  7. Gastrointestinal prophylaxis. On proton pump inhibitor.  8. CODE STATUS: Full code.   TOTAL TIME SPENT ON ADMISSION AND PATIENT CARE: 60 minutes.    ____________________________ Starleen Arms, MD dse:gb D: 01/05/2013 03:36:38 ET T: 01/05/2013 04:15:52 ET JOB#: [id number]  cc: Starleen Arms, MD, <Dictator> Nysia Dell Teena Irani MD ELECTRONICALLY SIGNED 01/07/2013 6:21

## 2015-04-01 NOTE — Consult Note (Signed)
PATIENT NAME:  Dawn Ellis, BOOTON I MR#:  [id number] DATE OF BIRTH:  11/29/1941  DATE OF CONSULTATION:  01/08/2013  CONSULTING PHYSICIAN:  [name]. Candace Cruise, MD, [name], NP  PRIMARY CARE PHYSICIAN: [name]. [name], MD  ATTENDING PHYSICIAN: [name]. [name], MD  REASON FOR CONSULTATION: Bloody stools.   HISTORY OF PRESENT ILLNESS: The patient is a 74 year old Caucasian female who was admitted to [facility] Emergency Room for the concern of shortness of breath, productive cough, which has been occurring since November of last year. She was hospitalized for 2 days in length during this course of time, treated with antibiotic therapy as well as steroid therapy. Unfortunately, symptoms recurred at Christmas. She was seen at [facility], was placed back on antibiotic therapy, which she believes was still Augmentin, and started on steroid taper. Resolved momentarily and then symptoms came back again, and when she was seen by Boykin Reaper, PA, last Thursday was started back on antibiotic therapy as well as a steroid taper. Sunday evening around 10:00, she advised her daughter she needed to present to the Emergency Room as dyspnea became worse, could not "catch her breath." Upon arrival to the Emergency Room, O2 sat was found to be 78%. Blood pressure was 200/something. The patient states that her breathing has gradually improved over the past several days. Yesterday evening, her abdomen started hurting in lower abdomen, requested a laxative, was given warm prune juice. Seven hours later, states her abdomen started rumbling, felt like she needed to go to the bathroom, sick feeling. Prior to that though, she was also given a partial dose of MiraLax. Upon defecating, noted evidence of bright red blood. She had had several episodes during the evening time as well as into this morning. Prior to being interviewed approximately an hour ago, states that a couple of hours before that was her last episode of  bleeding, though on exam noted in her Depends maroon-colored blood, no stool. Vomited twice yesterday evening and nauseated this morning. No history of GI blood loss similar to this in the past. She has noted scant amount of bright red blood occasionally on toilet paper but has been "a long time." Most recent and only colonoscopy was performed in 2008 by Dr. Rhona Leavens and colonoscopy findings were unremarkable. Currently, abdomen is tender to left lower quadrant as well as upper abdomen.   PAST MEDICAL HISTORY: Hypertension, hyperlipidemia, fibrocystic disease of breasts, uterine fibroids, history of atrial fibrillation, obesity, congestive heart failure, ventilator-assisted pneumonia and lung disease obstructive pattern.   PAST SURGICAL HISTORY: Laparoscopic cholecystectomy and cataract surgeries.   MEDICATIONS: Prednisone taper started last Thursday, lisinopril 40 mg a day, amiodarone 200 mg daily, digoxin 0.125 mg daily, aspirin 325 mg a day, Ambien 5 mg at bedtime, Ativan 0.5 mg 3 times a day as needed, Combivent as needed, Lasix 40 mg daily, potassium 20 mEq a day, Augmentin 1 tablet twice a day started last Thursday, omeprazole 40 mg a day.   ALLERGIES: None.   FAMILY HISTORY: Significant for lung cancer involving her father. Half-sister, lung cancer. Half-brother, colon cancer diagnosed in his 47s. No history of colonic polyps.   SOCIAL HISTORY: No tobacco. No alcohol use.   REVIEW OF SYSTEMS:  CONSTITUTIONAL: Denies any fevers or chills. Generalized weakness, fatigue. No weight loss. No weight gain.  EYES: No blurred vision, double vision.  ENT: No tinnitus. Some sore throat, productive cough. No postnasal drip.  RESPIRATORY: Significant for cough with productive yellow sputum.  Significant for wheezing, shortness of breath, especially with exertion.  CARDIOVASCULAR: No chest pain. No heart palpitations.  GASTROINTESTINAL: See HPI.  GENITOURINARY: Denies any hematuria or dysuria.   MUSCULOSKELETAL: Denies any neuralgias or myalgias.  SKIN: No rashes. No lesions.  NEUROLOGICAL: No history of migraines, ataxia, CVA.  ENDOCRINE: No polyuria, polydipsia, heat or cold intolerance.  PSYCHIATRIC: No depression. No anxiety.  HEMATOLOGIC: Denies significant easy bruising and bleeding.   PHYSICAL EXAMINATION:  VITAL SIGNS: Temperature is 97.9 with a pulse of 68, respirations 18, blood pressure 138/71 with a pulse ox 94% on O2.  GENERAL: Well developed, overweight 74 year old Caucasian female. No acute distress noted, but some evidence of labored breathing with verbal conversation.  HEENT: Normocephalic, atraumatic. Pupils equal, reactive to light. Conjunctivae clear. Sclerae anicteric.  NECK: Supple. Trachea midline. No lymphadenopathy or thyromegaly.  PULMONARY: Slightly decreased breath sounds noted throughout, but no adventitious sounds.  CARDIOVASCULAR: Regular rate and rhythm, S1, S2. No murmurs. No gallops.  ABDOMEN: Soft, nondistended. Bowel sounds in 4 quadrants, hyperactive. No bruits. No masses. No evidence of hepatosplenomegaly.  RECTAL: Evidence of maroon-colored blood noted in Depends. Evidence of active bleed.  EXTREMITIES: No edema.  MUSCULOSKELETAL: No contractures. No clubbing.  PSYCHIATRIC: Alert and oriented x 4. Memory grossly intact.  NEUROLOGICAL: No gross neurological deficits.   LABORATORY, DIAGNOSTIC, AND RADIOLOGICAL DATA: On January 27, glucose was 103. B-type natriuretic peptide 2268. BUN was 26. CO2 was 33. Today, January 30, glucose is 118, BUN 68, creatinine 1.29, sodium 134, chloride 93 and EGFR is 41. Hepatic panel on admission within normal limits. Troponin through 3 series check remains at a level of 0.04. CBC on admission: WBC was 16.0, RDW 14.9, neutrophils 13.8 with monocyte of 1.3. On January 30, WBC count elevated at 27. Hemoglobin was 12.2 at 6:21 and had dropped from 14.3. EKG: Normal sinus rhythm on January 27. Chest single view on  admission: Stable cardiomegaly, atherosclerotic disease, prominent interstitial markings.   IMPRESSION: Admitted for shortness of breath. Diagnosed with acute respiratory failure, chronic obstructive pulmonary disease, congestive heart failure. Known history of atrial fibrillation. Active lower gastrointestinal bleed onset yesterday evening. Continued evidence at this time.   PLAN: The patient's presentation was discussed with Dr. Verdie Shire. The patient was seen and evaluated by him as well. GI blood loss scan ordered for this afternoon to assess for active source of bleeding. If active source is noted, will proceed with vascular surgery consultation as patient would benefit for the consideration of embolization. If scan is negative, will proceed with colonic prep this afternoon, allowing the patient to be placed back on clear liquid diet as her status right now is n.p.o. TriLyte prep will be ordered. Will proceed with colonoscopy tomorrow under the care of Dr. Verdie Shire. CBC ordered q.8 h. for serial monitoring. If continued drop and to a level of 7 to 8, would recommend considering proceeding forward with a blood transfusion. Will continue to monitor the patient's laboratory studies as well as hemodynamic status.   These services provided by [name], MS, APRN, BC, ANP, under collaborative agreement with [name]. Candace Cruise, MD.   ____________________________ [name], NP dsh:jm D: 01/08/2013 13:50:18 ET T: 01/08/2013 14:23:22 ET JOB#: [id number]  cc: [name], NP, <Dictator> [name] MD ELECTRONICALLY SIGNED 01/08/2013 18:33

## 2015-04-01 NOTE — Consult Note (Signed)
Quick colonoscopy done with minimal sedation due to marginal oxygenation. Extensive acute ischemic colitis affecting descending colon and splenic flexure area. Bx's taken. Sigmoid tics seen but bleeding from ischemic colitis. WBC high either from lungs, prednisone, or colitis. On Abx now. Clear liquid diet but moniter for more bleeding. Dr. Marva PandaSkulskie will see patient over the weekend. Thanks   Electronic Signatures: Lutricia Feilh, Salma Walrond (MD) (Signed on 31-Jan-14 10:15)  Authored   Last Updated: 03-Feb-14 07:48 by Lutricia Feilh, Marlet Korte (MD)

## 2015-04-01 NOTE — Consult Note (Signed)
Chief Complaint:   Subjective/Chief Complaint No more passing blood. No abd pain. Started soft diet today. Still requiring O2.   VITAL SIGNS/ANCILLARY NOTES: **Vital Signs.:   03-Feb-14 12:18   Vital Signs Type Routine   Temperature Temperature (F) 98.5   Celsius 36.9   Pulse Pulse 68   Respirations Respirations 18   Systolic BP Systolic BP 140   Diastolic BP (mmHg) Diastolic BP (mmHg) 65   Mean BP 90   Pulse Ox % Pulse Ox % 95   Pulse Ox Activity Level  At rest   Oxygen Delivery 2L   Brief Assessment:   Cardiac Regular    Respiratory clear BS    Gastrointestinal Normal   Lab Results: TDMs:  03-Feb-14 04:02    Digoxin, Serum 1.29 (Therapeutic range for digoxin in patients with atrial fibrillation: 0.8 - 2.0 ng/mL. In patients with congestive heart failure a therapeutic range of 0.5 - 0.8 ng/mL is suggested as higher levels are associated with an increased risk of toxicity without clear evidence of enhanced efficacy. Digoxin toxicity is commonly associated with serum levels > 2.0 ng/mL but may occur with lower levels, including those in the therapeutic range. Blood samples should be obtained 6-8 hours after administration to assure a reasonable volume of distribution.)  Routine Hem:  03-Feb-14 04:02    WBC (CBC)  22.3   RBC (CBC)  3.56   Hemoglobin (CBC)  11.1   Hematocrit (CBC)  33.1   Platelet Count (CBC) 188   MCV 93   MCH 31.3   MCHC 33.6   RDW 13.9   Neutrophil % 84.8   Lymphocyte % 8.7   Monocyte % 6.3   Eosinophil % 0.1   Basophil % 0.1   Neutrophil #  19.0   Lymphocyte # 2.0   Monocyte #  1.4   Eosinophil # 0.0   Basophil # 0.0 (Result(s) reported on 12 Jan 2013 at 05:37AM.)   Assessment/Plan:  Assessment/Plan:   Assessment Likely ischemic colitis. Bx results pending.    Plan If no further bleeding, then no more GI evaluations needed. Pt can be discharged once resp status stable. Pt can f/u with us in few wks. Will sign off. Thanks.    Electronic Signatures: Lutricia Feilh, Iniya Matzek (MD)  (Signed 03-Feb-14 13:03)  Authored: Chief Complaint, VITAL SIGNS/ANCILLARY NOTES, Brief Assessment, Lab Results, Assessment/Plan   Last Updated: 03-Feb-14 13:03 by Lutricia Feilh, Magdalina Whitehead (MD)

## 2015-04-02 NOTE — Consult Note (Signed)
PATIENT NAME:  Dawn Ellis, Lateshia I MR#:  [id number] DATE OF BIRTH:  [birth year]  CARDIOLOGY CONSULTATION   DATE OF CONSULTATION:  05/20/2014  REFERRING PHYSICIAN:  John B. Danne HarborWalker III, MD CONSULTING PHYSICIAN:  Marcina MillardAlexander Hilliary Jock, MD  CHIEF COMPLAINT: Shortness of breath.   HISTORY OF PRESENT ILLNESS: The patient is a 74 year old female with history of hypertension. The patient was in her usual state of health until last evening, when she developed shortness of breath. The patient does have baseline chronic exertional dyspnea due to underlying COPD. She does have a prior history of atrial fibrillation and congestive heart failure. She was in her usual state of health, visiting her husband with Parkinson's at [employer]. She reports that she was upset, went outside and was talking to a friend and started to develop shortness of breath and diaphoresis without chest pain. The patient was brought to [facility] Emergency Room, where she was markedly hypertensive with a blood pressure of 244/118. Chest x-ray revealed pulmonary vascular congestion with possible superimposed pneumonia. The patient was treated with intravenous furosemide and nitro paste. Blood pressure eventually trended down. Initial troponin was elevated to 0.04. Initial blood gas revealed a pH of 7.22 with a pCO2 of 59. A 12-lead ECG revealed left bundle branch block, which was chronic. The patient was admitted to the CCU, where she has remained chest pain-free. Followup troponin was elevated to 27, total CPK and MB were 528 and 40.0, respectively.   PAST MEDICAL HISTORY:  1. Congestive heart failure.  2. Atrial fibrillation.  3. COPD.  4. Hypertension.  5. Hyperlipidemia.   MEDICATIONS:  1. Lisinopril 40 mg daily. 2. Klor-Con 20 mEq b.i.d.  3. Furosemide 40 mg daily. 4. Digoxin 0.125 mg daily.  5. Carvedilol 12.5 mg b.i.d. 6. Amiodarone 200 mg daily.  7. Omeprazole 40 mg daily. 8. Combivent inhaler q.i.d.  9. Ativan 0.5 mg q.6  p.r.n. 10. Ambien 5 mg daily.   SOCIAL HISTORY: The patient currently lives alone. Her husband is at [employer] with Parkinson's disease. The patient denies tobacco abuse.   FAMILY HISTORY: No immediate family history of coronary artery disease or myocardial infarction.   REVIEW OF SYSTEMS:  CONSTITUTIONAL: No fever or chills.  EYES: No blurry vision.  EARS: No hearing loss.  RESPIRATORY: The patient has shortness of breath as described above.  CARDIOVASCULAR: The patient denies chest pain.  GASTROINTESTINAL: No nausea, vomiting or diarrhea.  GENITOURINARY: No dysuria or hematuria.  ENDOCRINE: No polyuria or polydipsia.  MUSCULOSKELETAL: No arthralgias or myalgias.  NEUROLOGICAL: No focal muscle weakness or numbness.  PSYCHOLOGICAL: No depression or anxiety.   PHYSICAL EXAMINATION:  VITAL SIGNS: Blood pressure 162/46, pulse 81, respirations 29, temperature 98.2, pulse oximetry 99%.  HEENT: Pupils equally reactive to light and accommodation.  NECK: Supple without thyromegaly.  LUNGS: Reveal decreased breath sounds in both bases.  CARDIOVASCULAR: Normal JVP. Normal PMI. Regular rate and rhythm. Normal S1, S2. No appreciable gallop, murmur or rub.  ABDOMEN: Soft and nontender. Pulses were intact bilaterally.  MUSCULOSKELETAL: Normal muscle tone.  NEUROLOGICAL: The patient is alert and oriented x3. Motor and sensory both grossly intact.   IMPRESSION: A 74 year old female who presents with hypertensive emergency, with markedly elevated blood pressure and congestive heart failure. The patient has ruled in for myocardial infarction by CPK, isoenzymes and troponin. The patient just now received a dose of Lovenox. The patient currently is chest pain free. EKG is nondiagnostic with chronic left bundle branch block.   RECOMMENDATIONS:  1. Agree  with overall current therapy.  2. Would resume full-dose anticoagulation. Will switch to heparin bolus and drip.  3. Review 2-D echocardiogram.   4. Will proceed with cardiac catheterization with selective coronary arteriography in the a.m. The risks, benefits and alternatives were explained to the patient, and informed written consent was obtained.  ____________________________ Marcina Millard, MD ap:lb D: 05/20/2014 08:06:16 ET T: 05/20/2014 08:17:43 ET JOB#: [id number]  cc: Marcina Millard, MD, <Dictator> Marcina Millard MD ELECTRONICALLY SIGNED 05/25/2014 8:31

## 2015-04-02 NOTE — H&P (Signed)
PATIENT NAME:  Dawn Ellis, Dawn Ellis MR#:  [id number] DATE OF BIRTH:  1941/01/10  DATE OF ADMISSION:  05/19/2014  PRIMARY CARE PHYSICIAN:  Dr. Yates Decamp.   REFERRING EMERGENCY ROOM PHYSICIAN:  Dr. Darnelle Catalan.   CHIEF COMPLAINT:  Shortness of breath.   HISTORY OF PRESENT ILLNESS:  The patient is a 74 year old Caucasian female who suddenly became short of breath at around 8:30 p.m. associated with diaphoresis and weakness.  The patient was in her usual state of health until yesterday evening.  EMS were called as the patient's shortness of breath is not improving.  By the time EMS arrived, the patient was sating at 50%.  She was placed on BiPAP and she is brought into the ER.  With BiPAP, the patient's pulse ox improved up to 80%.  In the ED, the patient was with malignant hypertension at 244/118.  Chest x-ray has revealed pulmonary vascular congestion and possible superimposed pneumonia.  She was given Lasix IV, nitro paste was attached to the anterior chest wall and baby aspirin were given.  The patient denies any chest pain during the entire episode for shortness of breath.  After nitro paste, the patient's blood pressure gradually trended down to 157/71 and subsequently it was at 148/65.  Initial troponin is negative at 0.04.  BNP is elevated at 1322.  The patient's ABG has revealed initial pH of 7.22 with pCO2 59.  Repeat ABG while the patient was on BiPAP, the pH improved to 7.29 and pO2 was at 152.  The 12 lead EKG has revealed a left bundle branch block which is chronic in nature when compared to the old EKGs.  The ER physician has discussed with on-call cardiologist Dr. Gala Romney who thought it could be some degree of cardiac strain.  Hospitalist team is called to admit the patient.  During my examination, the patient's shortness of breath is significantly improved.  Denies any chest pain still.  States that she could breathe now.  Daughter is at bedside.  No other complaints.  Denies any loss of  consciousness.  Denies any fever.  Complaining of intermittent episodes of cough.  No sick contacts.   PAST MEDICAL HISTORY:  COPD, congestive heart failure, hypertension, hyperlipidemia, fibrocystic disease of the breast, history of atrial fibrillation, not on Coumadin.  History of ventilator associated pneumonia.   PAST SURGICAL HISTORY:  Cholecystectomy, cataract surgery, retina repair, right ankle repair.   ALLERGIES:  No known drug allergies.   PSYCHOSOCIAL HISTORY:  Lives at home, lives alone.  No history of smoking, alcohol or illicit drug usage.   FAMILY HISTORY:  Hypertension runs in her family.  Also lung cancer is present in her father and sister.    REVIE OF SYSTEMS:  CONSTITUTIONAL:  Denies any fever, chills, or fatigue.  Complaining of weakness.  EYES:  Denies blurry vision, double vision, glaucoma.  EARS, NOSE, THROAT:  Denies epistaxis, discharge, tinnitus.  RESPIRATION:  Complaining of intermittent episodes of cough and shortness of breath.  Denies any wheezing.  CARDIOVASCULAR:  No chest pain.  Denies palpitations.  Denies any orthopnea, syncope.  GASTROINTESTINAL:  Denies nausea, vomiting, diarrhea, hematemesis or melena.  GENITOURINARY:  No dysuria or hematuria.  No urinary incontinence.  MUSCULOSKELETAL:  Denies any myalgias.  No gout.  Denies any arthritis.  SKIN:  Denies any rashes, lesions or sores.  NEUROLOGIC:  Denies any vertigo, ataxia.  No history of CVA.  ENDOCRINE:  Denies polyuria, nocturia.  Denies heat or cold intolerance.  PSYCHIATRIC:  No  depression, anxiety.  Denies any ADD  and OCD.  HEMATOLOGIC AND LYMPHATIC:  No anemia, easy bruising or bleeding.   HOME MEDICATIONS:  Omeprazole 40 mg by mouth once daily, lisinopril 40 mg once daily, Klor-Con 20 mEq by mouth 2 times a day, furosemide 40 mg 1 tablet by mouth once daily, digoxin 125 mcg by mouth once daily, Combivent 1 puff inhalation 4 times a day, Coreg 12.5 mg 2 times a day,  Ativan 0.5 mg 1 tablet  by mouth q. 6 hours as needed, budesonide formoterol 2 puffs inhalation 2 times a day, aspirin 325 mg once daily, amiodarone 200 mg once daily, Ambien 5 mg by mouth once daily.   PHYSICAL EXAMINATION:  VITAL SIGNS:  Respirations 22, blood pressure 162/70, pulse ox 100%.  GENERAL APPEARANCE:  Not under acute distress.  Moderately built and nourished.  HEENT:  Normocephalic, atraumatic.  Pupils are equally reacting to light and accommodation.  No scleral icterus.  No conjunctival injection.  No sinus tenderness.  No postnasal drip.  NECK:  Supple.  No JVD.  No thyromegaly.  Range of motion is intact.  LUNGS:  Positive rales and rhonchi, positive crackles.  CARDIAC:  S1, S2 normal.  Regular rate and rhythm.  No murmurs. GASTROINTESTINAL:  Bowel sounds are positive in all four quadrants.  Nontender, nondistended.  No hepatosplenomegaly.  No masses felt.   NEUROLOGIC:  Awake, alert, oriented x 3.  Cranial nerves II through XII are grossly intact.  Motor and sensory are intact.  Reflexes are 2+.  EXTREMITIES:  1+ pitting edema.  No cyanosis.  No clubbing.  SKIN:  Warm to touch.  Normal turgor.  No rashes.  No lesions.  MUSCULOSKELETAL:  No joint effusion, tenderness, erythema.  PSYCHIATRIC:  Normal mood and affect.   LABORATORIES AND IMAGING STUDIES:  ABGs, initial pH 7.22, on FiO2 100% with pCO2 59 and pO2 103, lactic acid 2.1, PEEP 8.  Repeat ABGs at 2247 minutes has revealed pH of 7.29 on 100% FiO2, pCO2 53 and pO2 153.  A 12-lead EKG revealed left bundle branch block, wide-complex QRS with occasional PVCs.  Urinalysis yellow in color, clear in appearance.  Nitrites and leukocyte esterase are negative, glucose is negative, ketones are negative.  WBC 13.5, hemoglobin 14.3, hematocrit 45.6, platelets 267, troponin 0.04, glucose 315.  BNP 1322, BUN 19, creatinine is 0.99, sodium 136, potassium 5.2.  GFR 57, serum osmolality and calcium are normal.  Anion gap is at 9.  Portable chest x-ray, evidence of  congestive heart failure, superimposed pneumonia on the left cannot be excluded radiographically.   ASSESSMENT AND PLAN:  A 74 year old female presenting to the Emergency Room with a chief complaint of sudden onset of shortness of breath associated with hypoxia, found to be malignantly hypertensive, will be admitted with the following assessment and plan.  1.  Acute respiratory distress with hypoxia, probably from acute exacerbation of congestive heart failure/community acquired pneumonia and malignant hypertension.  We will admit her to Critical Care Unit, placed on BiPAP machine.  We will obtain a 2-D echocardiogram.  Cycle cardiac biomarkers.  We will provide her Lasix intravenous.  [facility].  2.  Malignant hypertension.  The patient was given nitroglycerin drip, which was eventually discontinued once the blood pressure was improved.  We will resume her home medications.  We will provide Lasix 40 mg intravenous q. 12 hours and uptitrate medications as needed basis.  3.  Pneumonia, most likely community-acquired.  We will provide levofloxacin intravenous.  4.  Left bundle branch block, which is chronic in nature.  5.  Flash pulmonary edema.  We will rule out acute myocardial infarction.  Cycle cardiac biomarkers.  Monitor her on telemetry.  6.  Chronic history of chronic obstructive pulmonary disease.  Not under exacerbation.  We will provide her DuoNeb treatments as-needed basis.  7.  Hyperlipidemia.  Check fasting lipid panel and continue statin.  8.  We will provide her gastrointestinal and deep vein thrombosis prophylaxis.  9.  CODE STATUS:  SHE IS FULL CODE.  Daughter is the medical power of attorney.    The patient will be transferred to Dr. Yates Decamp in the a.m.    Diagnosis and plan of care was discussed in detail with the patient and her daughter at bedside.  They both verbalized understanding of the plan.   Total time spent on the admission is  50 minutes.    ____________________________ Ramonita Lab, MD ag:ea D: 05/20/2014 00:45:48 ET T: 05/20/2014 01:33:05 ET JOB#: [id number]  cc: Ramonita Lab, MD, <Dictator> Ramonita Lab MD ELECTRONICALLY SIGNED 05/30/2014 0:55

## 2015-04-02 NOTE — Discharge Summary (Signed)
PATIENT NAME:  Dawn Ellis, Dawn Ellis MR#:  [id number] DATE OF BIRTH:  07-18-1941  DATE OF ADMISSION:  05/19/2014 DATE OF DISCHARGE:  05/23/2014  PRIMARY CARE PROVIDER: Letta PateJohn B. Danne HarborWalker III, MD  CONSULTANT: Marcina MillardAlexander Paraschos, MD, cardiology.   DISCHARGE DIAGNOSES: 1.  Acute myocardial infarction.  2.  Congestive heart failure.   DISCHARGE MEDICATIONS: 1.  Aspirin 325 mg daily.  2.  Plavix 75 mg daily.  3.  Ambien 5 mg at bedtime.  4.  Combivent inhaler 1 puff q.Ellis.d. as needed.  5.  Klor-Con 20 mEq twice daily.  6.  Omeprazole 40 mg daily.  7.  Ativan 0.5 mg q.6 hours as needed.  8.  Furosemide 40 mg daily.  9.  Lisinopril 40 mg daily.  10.  Amiodarone 200 mg daily.  11.  Digoxin 0.125 mg daily.  12.  Coreg 12.5 mg b.Ellis.d.  13.  Budesonide/formoterol 80 mcg/4.5 mcg inhalation 2 puffs b.Ellis.d.   HISTORY AND PHYSICAL: This is a 74 year old female with a history of hypertension, chronic obstructive pulmonary disease, atrial fibrillation, congestive heart failure, who presented to the Emergency Room with shortness of breath. She initially was found to be markedly hypertensive with blood pressure of 244/118. Chest x-ray revealed pulmonary vascular congestion with a possible superimposed pneumonia. The patient was treated with nitro paste and IV furosemide and her blood pressure gradually improved. EKG revealed no ST-T wave changes and a chronic left bundle branch block. Initial labs notable for an elevated troponin of 18, elevated CK of 528 and an elevated CK-MB of 40. The patient was admitted to the cardiac care unit.   HOSPITAL COURSE: The patient remained chest pain-free. She was initiated on Lovenox. She was monitored on telemetry and did not have any abnormalities or arrhythmias. Echocardiogram was obtained which showed left ventricular ejection fraction 60% to 65%, impaired relaxation of left ventricle, moderate left ventricular hypertrophy, mildly dilated right atrium and left atrium, mild mitral  valve replacement, mild aortic valve stenosis and tricuspid regurgitation. Dr. Darrold JunkerParaschos, cardiology, evaluated the patient and recommended cardiac catheterization. This was performed on May 21, 2014, and was notable for a finding of 95% proximal right coronary artery  stenosis. She underwent placement of a drug-eluding stent to the right coronary artery. She was initiated on aspirin and Plavix. She continued to be monitored for 24 hours in the Critical Care Unit and then was transferred to the floor. She remained chest pain-free and was discharged home in stable condition.   DISCHARGE INSTRUCTIONS: The patient should follow up with her cardiologist, Dr. Mariel KanskyKen Fath, in 1 to 2 weeks.   ____________________________ A. Wendall MolaMelissa Solum, MD ams:cs D: 05/23/2014 09:42:34 ET T: 05/23/2014 18:20:34 ET JOB#: [id number]  cc: A. Wendall MolaMelissa Solum, MD, <Dictator> Darlin PriestlyKenneth A. Lady GaryFath, MD Letta PateJohn B. Danne HarborWalker III, MD Caleen JobsA. MELISSA SOLUM MD ELECTRONICALLY SIGNED 05/24/2014 17:13
